# Patient Record
Sex: Female | Born: 1950 | Race: White | Hispanic: No | Marital: Married | Smoking: Former smoker
Health system: Southern US, Community
[De-identification: ages and names within clinical notes are randomized; demographics above are authoritative.]

## PROBLEM LIST (undated history)

## (undated) DIAGNOSIS — K219 Gastro-esophageal reflux disease without esophagitis: Secondary | ICD-10-CM

## (undated) DIAGNOSIS — F329 Major depressive disorder, single episode, unspecified: Secondary | ICD-10-CM

## (undated) DIAGNOSIS — F32A Depression, unspecified: Secondary | ICD-10-CM

## (undated) DIAGNOSIS — M1990 Unspecified osteoarthritis, unspecified site: Secondary | ICD-10-CM

## (undated) DIAGNOSIS — G5602 Carpal tunnel syndrome, left upper limb: Secondary | ICD-10-CM

## (undated) DIAGNOSIS — F988 Other specified behavioral and emotional disorders with onset usually occurring in childhood and adolescence: Secondary | ICD-10-CM

## (undated) DIAGNOSIS — E039 Hypothyroidism, unspecified: Secondary | ICD-10-CM

## (undated) DIAGNOSIS — Z862 Personal history of diseases of the blood and blood-forming organs and certain disorders involving the immune mechanism: Secondary | ICD-10-CM

## (undated) DIAGNOSIS — E7800 Pure hypercholesterolemia, unspecified: Secondary | ICD-10-CM

---

## 1998-07-16 ENCOUNTER — Other Ambulatory Visit: Admission: RE | Admit: 1998-07-16 | Discharge: 1998-07-16 | Payer: Self-pay | Admitting: Radiology

## 1998-09-19 ENCOUNTER — Other Ambulatory Visit: Admission: RE | Admit: 1998-09-19 | Discharge: 1998-09-19 | Payer: Self-pay | Admitting: *Deleted

## 1999-09-23 ENCOUNTER — Other Ambulatory Visit: Admission: RE | Admit: 1999-09-23 | Discharge: 1999-09-23 | Payer: Self-pay | Admitting: Obstetrics and Gynecology

## 1999-10-04 ENCOUNTER — Ambulatory Visit (HOSPITAL_COMMUNITY): Admission: RE | Admit: 1999-10-04 | Discharge: 1999-10-04 | Payer: Self-pay | Admitting: Gastroenterology

## 1999-10-04 ENCOUNTER — Encounter (INDEPENDENT_AMBULATORY_CARE_PROVIDER_SITE_OTHER): Payer: Self-pay | Admitting: Specialist

## 2000-09-09 ENCOUNTER — Ambulatory Visit (HOSPITAL_COMMUNITY): Admission: RE | Admit: 2000-09-09 | Discharge: 2000-09-09 | Payer: Self-pay | Admitting: Internal Medicine

## 2000-09-24 ENCOUNTER — Other Ambulatory Visit: Admission: RE | Admit: 2000-09-24 | Discharge: 2000-09-24 | Payer: Self-pay | Admitting: *Deleted

## 2001-06-29 ENCOUNTER — Encounter (INDEPENDENT_AMBULATORY_CARE_PROVIDER_SITE_OTHER): Payer: Self-pay | Admitting: Specialist

## 2001-06-29 ENCOUNTER — Ambulatory Visit (HOSPITAL_COMMUNITY): Admission: RE | Admit: 2001-06-29 | Discharge: 2001-06-29 | Payer: Self-pay | Admitting: Obstetrics and Gynecology

## 2001-06-29 HISTORY — PX: COMBINED HYSTEROSCOPY DIAGNOSTIC / D&C: SUR297

## 2001-09-14 ENCOUNTER — Other Ambulatory Visit: Admission: RE | Admit: 2001-09-14 | Discharge: 2001-09-14 | Payer: Self-pay | Admitting: Obstetrics and Gynecology

## 2002-09-26 ENCOUNTER — Other Ambulatory Visit: Admission: RE | Admit: 2002-09-26 | Discharge: 2002-09-26 | Payer: Self-pay | Admitting: Obstetrics and Gynecology

## 2003-09-28 ENCOUNTER — Other Ambulatory Visit: Admission: RE | Admit: 2003-09-28 | Discharge: 2003-09-28 | Payer: Self-pay | Admitting: Obstetrics and Gynecology

## 2009-04-03 ENCOUNTER — Encounter: Payer: Self-pay | Admitting: Cardiology

## 2009-05-15 ENCOUNTER — Ambulatory Visit: Payer: Self-pay | Admitting: Oncology

## 2009-05-29 LAB — CBC WITH DIFFERENTIAL/PLATELET
BASO%: 1.3 % (ref 0.0–2.0)
Basophils Absolute: 0 10*3/uL (ref 0.0–0.1)
EOS%: 5.3 % (ref 0.0–7.0)
Eosinophils Absolute: 0.2 10*3/uL (ref 0.0–0.5)
HCT: 43.9 % (ref 34.8–46.6)
HGB: 15.1 g/dL (ref 11.6–15.9)
LYMPH%: 41 % (ref 14.0–49.7)
MCH: 34.6 pg — ABNORMAL HIGH (ref 25.1–34.0)
MCHC: 34.3 g/dL (ref 31.5–36.0)
MCV: 100.9 fL (ref 79.5–101.0)
MONO#: 0.3 10*3/uL (ref 0.1–0.9)
MONO%: 8.8 % (ref 0.0–14.0)
NEUT#: 1.5 10*3/uL (ref 1.5–6.5)
NEUT%: 43.6 % (ref 38.4–76.8)
Platelets: 219 10*3/uL (ref 145–400)
RBC: 4.35 10*6/uL (ref 3.70–5.45)
RDW: 12.6 % (ref 11.2–14.5)
WBC: 3.5 10*3/uL — ABNORMAL LOW (ref 3.9–10.3)
lymph#: 1.4 10*3/uL (ref 0.9–3.3)

## 2009-05-29 LAB — CHCC SMEAR

## 2009-05-29 LAB — MORPHOLOGY
PLT EST: ADEQUATE
RBC Comments: NORMAL

## 2009-05-29 LAB — SEDIMENTATION RATE: Sed Rate: 1 mm/hr (ref 0–22)

## 2009-05-29 LAB — PROTHROMBIN TIME
INR: 0.92 (ref <1.50)
Prothrombin Time: 12.3 seconds (ref 11.6–15.2)

## 2009-05-29 LAB — APTT: aPTT: 28 seconds (ref 24–37)

## 2009-06-05 LAB — COMPREHENSIVE METABOLIC PANEL
ALT: 21 U/L (ref 0–35)
AST: 23 U/L (ref 0–37)
Albumin: 4.7 g/dL (ref 3.5–5.2)
Alkaline Phosphatase: 82 U/L (ref 39–117)
BUN: 17 mg/dL (ref 6–23)
CO2: 27 mEq/L (ref 19–32)
Calcium: 10.4 mg/dL (ref 8.4–10.5)
Chloride: 103 mEq/L (ref 96–112)
Creatinine, Ser: 0.79 mg/dL (ref 0.40–1.20)
Glucose, Bld: 92 mg/dL (ref 70–99)
Potassium: 4.3 mEq/L (ref 3.5–5.3)
Sodium: 142 mEq/L (ref 135–145)
Total Bilirubin: 0.3 mg/dL (ref 0.3–1.2)
Total Protein: 7 g/dL (ref 6.0–8.3)

## 2009-06-05 LAB — ANA: Anti Nuclear Antibody(ANA): POSITIVE — AB

## 2009-06-05 LAB — ANTI-NUCLEAR AB-TITER (ANA TITER): ANA Titer 1: 1:40 {titer} — ABNORMAL HIGH

## 2009-06-05 LAB — VON WILLEBRAND PANEL
Factor-VIII Activity: 108 % (ref 50–150)
Ristocetin-Cofactor: 137 % (ref 50–150)
Von Willebrand Ag: 127 % normal (ref 61–164)

## 2009-06-05 LAB — LACTATE DEHYDROGENASE: LDH: 213 U/L (ref 94–250)

## 2010-03-29 ENCOUNTER — Encounter: Payer: Self-pay | Admitting: Cardiology

## 2010-04-08 ENCOUNTER — Encounter: Payer: Self-pay | Admitting: Cardiology

## 2010-04-18 ENCOUNTER — Ambulatory Visit: Payer: Self-pay | Admitting: Cardiology

## 2010-04-18 DIAGNOSIS — E7800 Pure hypercholesterolemia, unspecified: Secondary | ICD-10-CM | POA: Insufficient documentation

## 2010-04-18 DIAGNOSIS — R000 Tachycardia, unspecified: Secondary | ICD-10-CM | POA: Insufficient documentation

## 2010-05-10 ENCOUNTER — Ambulatory Visit: Payer: Self-pay | Admitting: Cardiology

## 2010-05-10 ENCOUNTER — Ambulatory Visit: Payer: Self-pay

## 2010-06-22 ENCOUNTER — Encounter: Payer: Self-pay | Admitting: Obstetrics and Gynecology

## 2010-07-02 NOTE — Letter (Signed)
Summary: [facility] Annual Physical Exam Note   [facility] Annual Physical Exam Note   Imported By: Roderic Ovens 05/09/2010 09:05:56  _____________________________________________________________________  External Attachment:    Type:   Image     Comment:   External Document

## 2010-07-02 NOTE — Miscellaneous (Addendum)
Summary: Simvastatin 80 mg and l/l 8 weeks  Clinical Lists Changes  Medications: Added new medication of SIMVASTATIN 80 MG TABS (SIMVASTATIN) one daily - Signed Rx of SIMVASTATIN 80 MG TABS (SIMVASTATIN) one daily;  #30 x 11;  Signed;  Entered by: Charolotte Capuchin, RN;  Authorized by: Rollene Rotunda, MD, [facility];  Method used: [facility] [address] [phone]*, [address], [place], [place], [place]  [address], Ph: [phone] or [phone], Fax: [phone]    Prescriptions: SIMVASTATIN 80 MG TABS (SIMVASTATIN) one daily  #30 x 11   Entered by:   Charolotte Capuchin, RN   Authorized by:   Rollene Rotunda, MD, [facility]   Signed by:   Charolotte Capuchin, RN on 05/10/2010   Method used:   [facility]ronically to        [facility] [address] [phone]* (retail)       [address]       [place], [place]  [address]       Ph: [phone] or [phone]       Fax: [phone]   RxID:   [id number]   Have fasting lipid and liver profile in 8 weeks   272.0  v58.69  Appended Document: Simvastatin 80 mg and l/l 8 weeks Prescription is to be for pravastatin 80 mg. Discussed with the patient and she has not yet started the simvastatin.  We will change the prescription.

## 2010-07-02 NOTE — Assessment & Plan Note (Signed)
Summary: [facility]   Visit Type:  Initial Consult Primary Provider:  Dr. Rodrigo Ran  CC:  Tachycardia and hyperlipidemia.  History of Present Illness: Patient presents for evaluation of dyslipidemia. She has no history of coronary artery disease. She has never had any cardiovascular testing. However, she has had dyslipidemia. The most recent cholesterol demonstrated a total of 318, triglycerides 100, HDL 96 and LDL 200. The subfractionation of this apparently shows a ratio of large to small particle size. Lipid therapy has not been indicated. However, the patient wanted to discuss this further and was concerned about overall risk.  She does not have any overt cardiovascular symptoms. In particular she denies palpitations, presyncope or syncope. She does know that she has a rapid resting heart rate at times. She does have ADHD and takes medications. She is "high strung". She does not exercise routinely though she says she is "always on the go". She denies any chest pressure, neck or arm discomfort. She says she has no shortness of breath, PND or orthopnea. \ Current Medications (verified): 1)  Levothroid 75 Mcg Tabs (Levothyroxine Sodium) .... Uad 2)  Vyvanse 60 Mg Caps (Lisdexamfetamine Dimesylate) .Marland Kitchen.. 1 By Mouth Daily 3)  Effexor Xr 75 Mg Xr24h-Cap (Venlafaxine Hcl) .... 3 By Mouth Daily 4)  Protonix 40 Mg Solr (Pantoprazole Sodium) .Marland Kitchen.. 1 By Mouth Daily 5)  Vitamin D3 2000 Unit Caps (Cholecalciferol) .Marland Kitchen.. 1 By Mouth Daily 6)  Caltrate 600 1500 Mg Tabs (Calcium Carbonate) .... 2 By Mouth Daily 7)  Zyrtec Allergy 10 Mg Tabs (Cetirizine Hcl) .Marland Kitchen.. 1 By Mouth Daily 8)  Vitamin C 1000 Mg Tabs (Ascorbic Acid) .Marland Kitchen.. 1 By Mouth Daily 9)  Multivitamins   Tabs (Multiple Vitamin) .Marland Kitchen.. 1 By Mouth Daily 10)  Vitamin E 400 Unit Chew (Vitamin E) .Marland Kitchen.. 1 By Mouth Daily 11)  Fish Oil   Oil (Fish Oil) .Marland Kitchen.. 1 By Mouth Daily 12)  Coq10 30 Mg Caps (Coenzyme Q10) .Marland Kitchen.. 1 By Mouth Daily  Allergies  (verified): 1)  ! Penicillin  Past History:  Past Medical History: ADD Anxiety/situational depression Chronic headaches Dyslipidemia Asthma Osteoarthritis  Family History: No early heart disease  Social History: She is married. 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 She quit smoking in 1975 after a brief history.  Review of Systems       Positive occasional dizziness, constipation, reflux, joint pains. Otherwise as stated in the history of present illness negative for all other systems.  Vital Signs:  Patient profile:   60 year old female Height:      62 inches Weight:      114 pounds BMI:     20.93 Pulse rate:   104 / minute Resp:     16 per minute BP sitting:   122 / 78  (right arm)  Vitals Entered By: Marrion Coy, CNA (April 18, 2010 10:35 AM)  Physical Exam  General:  Well developed, well nourished, in no acute distress. Head:  normocephalic and atraumatic Eyes:  PERRLA/EOM intact; conjunctiva and lids normal. Mouth:  Teeth, gums and palate normal. Oral mucosa normal. Neck:  Neck supple, no JVD. No masses, thyromegaly or abnormal cervical nodes. Chest Wall:  no deformities or breast masses noted Lungs:  Clear bilaterally to auscultation and percussion. Abdomen:  Bowel sounds positive; abdomen soft and non-tender without masses, organomegaly, or hernias noted. No hepatosplenomegaly. Msk:  Back normal, normal gait. Muscle strength and tone normal. Extremities:  No clubbing or  cyanosis. Neurologic:  Alert and oriented x 3. Skin:  Intact without lesions or rashes. Cervical Nodes:  no significant adenopathy Axillary Nodes:  no significant adenopathy Inguinal Nodes:  no significant adenopathy Psych:  Normal affect.   Detailed Cardiovascular Exam  Neck    Carotids: Carotids full and equal bilaterally without bruits.      Neck Veins: Normal, no JVD.    Heart    Inspection: no deformities or lifts noted.        Palpation: normal PMI with no thrills palpable.      Auscultation: regular rate and rhythm, S1, S2 without murmurs, rubs, gallops, or clicks.    Vascular    Abdominal Aorta: no palpable masses, pulsations, or audible bruits.      Femoral Pulses: normal femoral pulses bilaterally.      Pedal Pulses: normal pedal pulses bilaterally.      Radial Pulses: normal radial pulses bilaterally.      Peripheral Circulation: no clubbing, cyanosis, or edema noted with normal capillary refill.     EKG  Procedure date:  04/18/2010  Findings:      Sinus rhythm, rate 103, axis within normal limits, diffuse nonspecific T wave flattening  Impression & Recommendations:  Problem # 1:  HYPERCHOLESTEROLEMIA (ICD-272.0) She does have a markedly elevated LDL and current guidelines would suggest if this does not fall below 160 treatment with a statin would be reasonable. However, I do agree that the more important LDL particle is p and not c.  I would like to review her Liposcience and discuss this further with her.  Problem # 2:  UNSPECIFIED TACHYCARDIA (ICD-785.0)  Orders: EKG w/ Interpretation (93000) Treadmill (Treadmill)  Patient Instructions: 1)  Your physician recommends that you schedule a follow-up appointment at the time of your treadmill 2)  Your physician recommends that you continue on your current medications as directed. Please refer to the Current Medication list given to you today. 3)  Your physician has requested that you have an exercise tolerance test.  For further information please visit [email or web address]  Please also follow instruction sheet, as given.

## 2010-07-02 NOTE — Assessment & Plan Note (Signed)
Summary: DUPLICATE   DO NOT USE    Impression & Recommendations: Duplicate  DO NOT USE  Other Orders: EKG w/ Interpretation (93000)  Patient Instructions: 1)  Your physician recommends that you schedule a follow-up appointment at the time of your treadmill 2)  Your physician recommends that you continue on your current medications as directed. Please refer to the Current Medication list given to you today. 3)  Your physician has requested that you have an exercise tolerance test.  For further information please visit [email or web address]  Please also follow instruction sheet, as given.

## 2010-07-04 NOTE — Miscellaneous (Signed)
Summary: pravastatin 80 mg    Clinical Lists Changes  Medications: Removed medication of SIMVASTATIN 80 MG TABS (SIMVASTATIN) one daily Added new medication of PRAVASTATIN SODIUM 80 MG TABS (PRAVASTATIN SODIUM) one every evening - Signed Rx of PRAVASTATIN SODIUM 80 MG TABS (PRAVASTATIN SODIUM) one every evening;  #30 x 11;  Signed;  Entered by: Charolotte Capuchin, RN;  Authorized by: Rollene Rotunda, MD, [facility];  Method used: [facility] [address] [phone]*, [address], [place], [place], [place]  [address], Ph: [phone] or [phone], Fax: [phone]    Prescriptions: PRAVASTATIN SODIUM 80 MG TABS (PRAVASTATIN SODIUM) one every evening  #30 x 11   Entered by:   Charolotte Capuchin, RN   Authorized by:   Rollene Rotunda, MD, [facility]   Signed by:   Charolotte Capuchin, RN on 05/13/2010   Method used:   [facility]ronically to        [facility] [address] [phone]* (retail)       [address]       [place], [place]  [address]       Ph: [phone] or [phone]       Fax: [phone]   RxID:   [id number]

## 2010-07-04 NOTE — Letter (Signed)
Summary: [facility] Annual Physical Note   [facility] Annual Physical Note   Imported By: Roderic Ovens 05/14/2010 16:18:13  _____________________________________________________________________  External Attachment:    Type:   Image     Comment:   External Document

## 2010-08-07 ENCOUNTER — Other Ambulatory Visit: Payer: Self-pay

## 2010-08-21 ENCOUNTER — Ambulatory Visit (INDEPENDENT_AMBULATORY_CARE_PROVIDER_SITE_OTHER): Payer: BC Managed Care – PPO | Admitting: *Deleted

## 2010-08-21 DIAGNOSIS — E7800 Pure hypercholesterolemia, unspecified: Secondary | ICD-10-CM

## 2010-08-21 DIAGNOSIS — Z79899 Other long term (current) drug therapy: Secondary | ICD-10-CM

## 2010-08-21 DIAGNOSIS — E785 Hyperlipidemia, unspecified: Secondary | ICD-10-CM

## 2010-08-21 LAB — HEPATIC FUNCTION PANEL
ALT: 22 U/L (ref 0–35)
AST: 25 U/L (ref 0–37)
Albumin: 4.2 g/dL (ref 3.5–5.2)
Alkaline Phosphatase: 56 U/L (ref 39–117)
Bilirubin, Direct: 0.1 mg/dL (ref 0.0–0.3)
Total Bilirubin: 0.4 mg/dL (ref 0.3–1.2)
Total Protein: 6.4 g/dL (ref 6.0–8.3)

## 2010-08-21 LAB — LIPID PANEL
Cholesterol: 252 mg/dL — ABNORMAL HIGH (ref 0–200)
HDL: 100.1 mg/dL (ref >39.00)
Total CHOL/HDL Ratio: 3
Triglycerides: 89 mg/dL (ref 0.0–149.0)
VLDL: 17.8 mg/dL (ref 0.0–40.0)

## 2010-08-21 LAB — LDL CHOLESTEROL, DIRECT: Direct LDL: 127 mg/dL

## 2010-08-28 ENCOUNTER — Encounter: Payer: Self-pay | Admitting: *Deleted

## 2010-08-28 NOTE — Progress Notes (Signed)
Letter of results mailed to pt. 

## 2010-08-28 NOTE — Progress Notes (Signed)
Letter of results mailed to patient.

## 2010-09-04 ENCOUNTER — Telehealth: Payer: Self-pay | Admitting: Cardiology

## 2010-09-04 NOTE — Telephone Encounter (Signed)
Patient wanted to know if she needs to have  a liver profile done, because she only got the Lipid profile result. I let pt. Know she got a liver profile the same day with lipid profile on 08/21/10. Results Faxed to [phone] per patient request.

## 2010-10-18 NOTE — H&P (Signed)
[facility]  Patient:    [name], [name] Visit Number: [id number] MRN: [id number]          Service Type: DSU Location: [facility] Attending Physician:  Lenoard Aden Dictated by:   Lenoard Aden, M.D. Admit Date:  06/29/2001   CC:         [facility]                         History and Physical  PREOPERATIVE DIAGNOSES: 1. Post menopausal bleeding. 2. History of menometrial hyperplasia.  HISTORY OF PRESENT ILLNESS:  A 60 year old white female G1, P1 with a history of persistent post menopausal bleeding who presents for definitive evaluation in the form of hysteroscopy.  PAST MEDICAL HISTORY:  ALLERGIES:  Remarkable for allergies to PENICILLIN.  MEDICATIONS:  Prometrium.  PAST MEDICAL HISTORY:  History of asthma and osteopenia and hypothyroidism.  MEDICATIONS: 1. Levoxyl 2. Fosamax 3. Effexor 4. Flonase 5. Fiorinal for migraine headaches. 6. Pulmicort.  PREVIOUS HOSPITALIZATIONS:  Previous hospitalization related to asthma and childbirth.  PHYSICAL EXAMINATION:  GENERAL:  She is 5 foot 2 inches, 114 lbs.  HEENT:  Normal.  LUNGS:  Clear.  HEART:  Regular rate and rhythm.  ABDOMEN:  Soft, scaphoid, nontender.  PELVIC EXAMINATION:  Reveals a small anteflexed uterus.  No adnexal masses on Saline sonohysterogram with posterior thickening and questionable endometrial polyp.  EXTREMITIES:  Show no cords.  NEUROLOGIC:  Nonfocal.  IMPRESSION:  Simple hyperplasia with persistent postmenopausal bleeding. Nontolerance to progestational therapy with continued intermittent bleeding.  PLAN:  Proceed with diagnostic hysteroscopy dilatation and curettage. The risks of anesthesia, infection, bleeding, uterine perforation and need for repair is discussed.  The patient Acknowledges and desires to proceed. Delayed versus immediate complications to include bowel and bladder injury with need for repair noted. Dictated by:    Lenoard Aden, M.D. Attending Physician:  Lenoard Aden DD:  06/29/01 TD:  06/29/01 Job: [id number]

## 2010-10-18 NOTE — Op Note (Signed)
[facility]  Patient:    [name], [name]                     MRN: [id number] Proc. Date: 10/04/99 Adm. Date:  [id number] Disc. Date: [id number] Attending:  Nelda Marseille CC:         Petra Kuba, M.D.             Edwin L. Judie Grieve, M.D.             Sung Amabile Roslyn Smiling, M.D.                           Operative Report  PROCEDURE:  Colonoscopy.  INDICATIONS FOR PROCEDURE:  Guaiac positivity and normal rectal examination. Consent was signed after risks, benefits, methods and options were thoroughly discussed in the office.  MEDICATIONS USED:  Demerol 100 mg, Versed 10 mg.  DESCRIPTION OF PROCEDURE:  Rectal inspection was pertinent for external hemorrhoids -- small.  Digital examination was negative.  I did not appreciate any mass or polyps.  The pediatric video colonoscope was inserted; no rectal abnormality was seen. The scope was then retroflexed, revealing an internal hemorrhoid, but no other abnormality.  The scope was then straightened; there was some difficulty due to a tortuous colon.  We were able to advance to the cecum (this did required lowering her on her back and some abdominal pressure.  The cecum was identified by the appendiceal orifice and the ileocecal valve.  No obvious abnormality was seen on insertion.   We then insert the scope into the terminal ileum, which was normal.  Photo documentation was obtained.  The scope was slowly withdrawn into the cecum, ascending, and transverse (all normal).  In the mid descending a tiny, questionable 1-2 mm polyp was seen. This was cold biopsied x 1; possibly this was just some redness through the scope.  The scope was further withdrawn.  No other abnormalities were seen  as we withdrew back to the rectum.  Anorectal pull-through confirmed the hemorrhoids.  The scope was again retroflexed; again confirmed the hemorrhoids. The scope was drained, air was withdrawn and the scope removed.  The  patient tolerated the procedure well.  There were no obvious or immediate complications.  ENDOSCOPIC DIAGNOSES: 1. Internal and external hemorrhoids. 2. Tortuous colon. 3. Tiny, questionable mid descending polyps, status post cold biopsy. 4. Large than within normal limits of the terminal ileum.  PLAN:  Await pathology to determine the future colonic screening.  New rectals and guaiacs per the gynecologist or primary care.  Will see the patient back in six weeks to recheck rectal guaiac and make sure no further workup plans are needed.  Possibly will need a repeat CBC. Have the patient call me sooner or p.r.n. DD:  10/04/99 TD:  10/07/99 Job: [id number]

## 2010-10-18 NOTE — Op Note (Signed)
[facility]  Patient:    [name], [name] Visit Number: [id number] MRN: [id number]          Service Type: DSU Location: [facility] Attending Physician:  Lenoard Aden Dictated by:   Lenoard Aden, M.D. Proc. Date: 06/29/01 Admit Date:  06/29/2001                             Operative Report  PREOPERATIVE DIAGNOSIS:       Postmenopausal bleeding and cervical stenosis.  POSTOPERATIVE DIAGNOSIS:      Postmenopausal bleeding and cervical stenosis, endometrial polyp.  OPERATION:                    Diagnostic hysteroscopy with resectoscopic polypectomy, dilatation and curettage.  SURGEON:                      Lenoard Aden, M.D.  ANESTHESIA:                   General.  ESTIMATED BLOOD LOSS:         50 cc.  COMPLICATIONS:                None.  FLUID DEFICIT:                50 cc.  SPECIMEN:                     Endometrial polyp and endometrial curetting.  DISPOSITION:                  To recovery room in good condition.  PROCEDURE:                    After being apprised of the risks of anesthesia, infection, bleeding, uterine perforation and need for repair, patient was brought to the operating room where she was administered general anesthetic without complications.  Prepped and draped in usual sterile fashion. Catheterized until the bladder was empty.  At this time weighted speculum placed.  Dilute Pitressin solution placed at 3 and 9 oclock at the cervicovaginal junction without difficulty.  Cervix was easily dilated after previous removal prior to prepping of the two sponges and laminaria without difficulty which made the cervix easily dilated #31 Pratt dilator. Resectoscope and hysteroscope placed.  Visualization reveals a polypoid mass along the posterior wall which was resected without difficulty and sent to pathology.  The intrauterine cavity appears to be thickened with endometrium but no other discreet masses are noted.   Tubal ostia appear normal. Endocervical canal appears normal.  D&C is performed.  Revisualization reveals good hemostasis and no evidence of focal masses.  At this time the procedure is discontinued.  Fluid deficit of 50 cc is noted.  Patient tolerates the procedure well.  Is awakened and transferred to recovery in good condition. Dictated by:   Lenoard Aden, M.D. Attending Physician:  Lenoard Aden DD:  06/29/01 TD:  06/29/01 Job: [id number]

## 2011-05-01 ENCOUNTER — Other Ambulatory Visit: Payer: Self-pay | Admitting: Obstetrics and Gynecology

## 2011-05-01 DIAGNOSIS — M949 Disorder of cartilage, unspecified: Secondary | ICD-10-CM

## 2011-05-01 DIAGNOSIS — M899 Disorder of bone, unspecified: Secondary | ICD-10-CM

## 2011-06-26 ENCOUNTER — Other Ambulatory Visit: Payer: Self-pay | Admitting: Cardiology

## 2011-06-26 NOTE — Telephone Encounter (Signed)
PT OUT OF MED, PHARMACY SAID THEY SENT REQUEST BEGINNING OF WEEK, BUT I TOLD HER I ONLY SEE THE ONE FOR THIS AM, NEEDS CALED IN TODAY, PT WANTS TO KNOW IF LABS  ARE NEEDED TO CONTINUE MED, HASN'T SEEN DR OR HAD LABS SINCE STARTED MED TO PLS CALL PT [phone]

## 2012-01-01 DIAGNOSIS — G5602 Carpal tunnel syndrome, left upper limb: Secondary | ICD-10-CM

## 2012-01-01 HISTORY — DX: Carpal tunnel syndrome, left upper limb: G56.02

## 2012-01-26 ENCOUNTER — Encounter (HOSPITAL_BASED_OUTPATIENT_CLINIC_OR_DEPARTMENT_OTHER): Payer: Self-pay | Admitting: *Deleted

## 2012-01-26 ENCOUNTER — Other Ambulatory Visit: Payer: Self-pay | Admitting: Orthopedic Surgery

## 2012-02-02 NOTE — H&P (Signed)
  [name] is an 61 y.o. female.   Chief Complaint: c/o chronic and progressive numbness and tingling of the left hand  HPI: .  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  She reports a history of hand numbness dating back to 2004.  At that time, she was evaluated by Dr. Phylliss Snyder for degenerative arthritis symptoms and hand numbness.  She was referred to see Dr. Meryl Snyder and had an electrodiagnostic study that was reportedly positive for carpal tunnel syndrome.  She has been using nighttime splints.  Recently, she has been experiencing hand numbness seven out of seven nights a week.  She has had some stiffness and discomfort in her cervical spine with extension and rotation movements.  To date, she has had no formal evaluation of her cervical spine.  Past Medical History  Diagnosis Date  . Hypothyroidism   . History of anemia     no current problems  . Osteoarthritis   . GERD (gastroesophageal reflux disease)   . Attention deficit disorder (ADD)   . Depression   . 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 Bruises easily   . Abrasion of hand, left 01/26/2012  . High cholesterol     Past Surgical History  Procedure Date  . Combined hysteroscopy diagnostic / d&c 06/29/2001    with resectoscopic polypectomy    History reviewed. No pertinent family history. Social History:  reports that she has never smoked. She has never used smokeless tobacco. She reports that she does not drink alcohol or use illicit drugs.  Allergies:  Allergies  Allergen Reactions  . Penicillins Other (See Comments)    SYNCOPE    No prescriptions prior to admission    No results found for this or any previous visit (from the past 48 hour(s)).  No results found.   Pertinent items are noted in HPI.  Height 5' 2.5" (1.588 m), weight 48.535 kg (107 lb).  General appearance: alert Head: Normocephalic, without obvious abnormality Neck: supple, symmetrical, trachea  midline Resp: clear to auscultation bilaterally Cardio: regular rate and rhythm GI: normal findings: bowel sounds normal Extremities: .  Inspection of her hands reveals no thenar atrophy.  Her sweat patterns and dermatoglyphics are normal.  She has a positive wrist flexion test bilaterally and a positive Tinel's percussion sign bilaterally.  She has no sign of stenosing tenosynovitis.  She has slight discomfort at the limits of cervical rotation, but no radicular signs or symptoms. Her pulses and capillary refill are normal.  She has mild Heberden's nodes.  X-rays of her right and left hands reveal normal bony anatomy.  Her carpus is normal in appearance bilaterally.  Dr. Johna Snyder completed detailed electrodiagnostic studies.  These are remarkable for bilateral carpal tunnel syndrome, left greater than right. Pulses: 2+ and symmetric Skin: normal Neurologic: Grossly normal Hand exam:  minimal triggering of long and ring fingers of left hand equivalent to mild STS    Assessment/Plan Impression:Left CTS, tendonitis related symptoms  Plan: To the OR for Left CTR.The procedure, risks,benefits and post-op course were discussed with the patient at length and she was in agreement with the plan.  [name],[name] 02/02/2012, 8:02 PM  H&P documentation: 02/03/2012  -History and Physical Reviewed  -Patient has been re-examined  -No change in the plan of care  Destiny Forster, MD

## 2012-02-03 ENCOUNTER — Encounter (HOSPITAL_BASED_OUTPATIENT_CLINIC_OR_DEPARTMENT_OTHER): Payer: Self-pay | Admitting: Certified Registered Nurse Anesthetist

## 2012-02-03 ENCOUNTER — Ambulatory Visit (HOSPITAL_BASED_OUTPATIENT_CLINIC_OR_DEPARTMENT_OTHER): Payer: BC Managed Care – PPO | Admitting: Certified Registered Nurse Anesthetist

## 2012-02-03 ENCOUNTER — Encounter (HOSPITAL_BASED_OUTPATIENT_CLINIC_OR_DEPARTMENT_OTHER)
Admission: RE | Disposition: A | Discharge status: Home / Self Care | Payer: Self-pay | Source: Ambulatory Visit | Attending: Orthopedic Surgery

## 2012-02-03 ENCOUNTER — Ambulatory Visit (HOSPITAL_BASED_OUTPATIENT_CLINIC_OR_DEPARTMENT_OTHER)
Admission: RE | Admit: 2012-02-03 | Discharge: 2012-02-03 | Disposition: A | Discharge status: Home / Self Care | Payer: BC Managed Care – PPO | Source: Ambulatory Visit | Attending: Orthopedic Surgery | Admitting: Orthopedic Surgery

## 2012-02-03 ENCOUNTER — Encounter (HOSPITAL_BASED_OUTPATIENT_CLINIC_OR_DEPARTMENT_OTHER): Payer: Self-pay

## 2012-02-03 DIAGNOSIS — E7800 Pure hypercholesterolemia, unspecified: Secondary | ICD-10-CM | POA: Insufficient documentation

## 2012-02-03 DIAGNOSIS — M1990 Unspecified osteoarthritis, unspecified site: Secondary | ICD-10-CM | POA: Insufficient documentation

## 2012-02-03 DIAGNOSIS — E039 Hypothyroidism, unspecified: Secondary | ICD-10-CM | POA: Insufficient documentation

## 2012-02-03 DIAGNOSIS — K219 Gastro-esophageal reflux disease without esophagitis: Secondary | ICD-10-CM | POA: Insufficient documentation

## 2012-02-03 DIAGNOSIS — F988 Other specified behavioral and emotional disorders with onset usually occurring in childhood and adolescence: Secondary | ICD-10-CM | POA: Insufficient documentation

## 2012-02-03 DIAGNOSIS — G5600 Carpal tunnel syndrome, unspecified upper limb: Secondary | ICD-10-CM | POA: Insufficient documentation

## 2012-02-03 HISTORY — DX: Depression, unspecified: F32.A

## 2012-02-03 HISTORY — DX: Major depressive disorder, single episode, unspecified: F32.9

## 2012-02-03 HISTORY — DX: Other specified behavioral and emotional disorders with onset usually occurring in childhood and adolescence: F98.8

## 2012-02-03 HISTORY — DX: Gastro-esophageal reflux disease without esophagitis: K21.9

## 2012-02-03 HISTORY — DX: Hypothyroidism, unspecified: E03.9

## 2012-02-03 HISTORY — DX: Pure hypercholesterolemia, unspecified: E78.00

## 2012-02-03 HISTORY — PX: CARPAL TUNNEL RELEASE: SHX101

## 2012-02-03 HISTORY — DX: Personal history of diseases of the blood and blood-forming organs and certain disorders involving the immune mechanism: Z86.2

## 2012-02-03 HISTORY — DX: Unspecified osteoarthritis, unspecified site: M19.90

## 2012-02-03 HISTORY — DX: Carpal tunnel syndrome, left upper limb: G56.02

## 2012-02-03 SURGERY — CARPAL TUNNEL RELEASE
Anesthesia: General | Site: Wrist | Laterality: Left | Wound class: Clean

## 2012-02-03 MED ORDER — OXYCODONE HCL 5 MG/5ML PO SOLN: 5.0000 mg | Freq: Once | ORAL | Status: DC | PRN

## 2012-02-03 MED ORDER — HYDROMORPHONE HCL PF 1 MG/ML IJ SOLN: 0.2500 mg | INTRAMUSCULAR | Status: DC | PRN

## 2012-02-03 MED ORDER — OXYCODONE HCL 5 MG PO TABS: 5.0000 mg | ORAL_TABLET | Freq: Once | ORAL | Status: DC | PRN

## 2012-02-03 MED ORDER — METOCLOPRAMIDE HCL 5 MG/ML IJ SOLN: 10.0000 mg | Freq: Once | INTRAMUSCULAR | Status: DC | PRN

## 2012-02-03 MED ORDER — CHLORHEXIDINE GLUCONATE 4 % EX LIQD: 60.0000 mL | Freq: Once | CUTANEOUS | Status: DC

## 2012-02-03 MED ORDER — MIDAZOLAM HCL 5 MG/5ML IJ SOLN
INTRAMUSCULAR | Status: DC | PRN
  Administered 2012-02-03: 1 mg via INTRAVENOUS

## 2012-02-03 MED ORDER — ONDANSETRON HCL 4 MG/2ML IJ SOLN
INTRAMUSCULAR | Status: DC | PRN
  Administered 2012-02-03: 4 mg via INTRAVENOUS

## 2012-02-03 MED ORDER — LACTATED RINGERS IV SOLN
INTRAVENOUS | Status: DC
  Administered 2012-02-03 (×2): via INTRAVENOUS

## 2012-02-03 MED ORDER — LIDOCAINE HCL 2 % IJ SOLN
INTRAMUSCULAR | Status: DC | PRN
  Administered 2012-02-03: 3 mL

## 2012-02-03 MED ORDER — LIDOCAINE HCL (CARDIAC) 20 MG/ML IV SOLN
INTRAVENOUS | Status: DC | PRN
  Administered 2012-02-03: 50 mg via INTRAVENOUS

## 2012-02-03 MED ORDER — DEXAMETHASONE SODIUM PHOSPHATE 10 MG/ML IJ SOLN
INTRAMUSCULAR | Status: DC | PRN
  Administered 2012-02-03: 10 mg via INTRAVENOUS

## 2012-02-03 MED ORDER — PROPOFOL 10 MG/ML IV BOLUS
INTRAVENOUS | Status: DC | PRN
  Administered 2012-02-03: 200 mg via INTRAVENOUS

## 2012-02-03 MED ORDER — TRAMADOL HCL 50 MG PO TABS: ORAL_TABLET | ORAL | Status: AC

## 2012-02-03 MED ORDER — FENTANYL CITRATE 0.05 MG/ML IJ SOLN
INTRAMUSCULAR | Status: DC | PRN
  Administered 2012-02-03: 50 ug via INTRAVENOUS

## 2012-02-03 SURGICAL SUPPLY — 40 items
BANDAGE ADHESIVE 1X3 (GAUZE/BANDAGES/DRESSINGS) IMPLANT
BANDAGE ELASTIC 3 VELCRO ST LF (GAUZE/BANDAGES/DRESSINGS) ×2 IMPLANT
BLADE SURG 15 STRL LF DISP TIS (BLADE) ×1 IMPLANT
BLADE SURG 15 STRL SS (BLADE) ×2
BNDG CMPR 9X4 STRL LF SNTH (GAUZE/BANDAGES/DRESSINGS)
BNDG ESMARK 4X9 LF (GAUZE/BANDAGES/DRESSINGS) IMPLANT
BRUSH SCRUB EZ PLAIN DRY (MISCELLANEOUS) ×2 IMPLANT
CLOTH BEACON ORANGE TIMEOUT ST (SAFETY) ×2 IMPLANT
CORDS BIPOLAR (ELECTRODE) IMPLANT
COVER MAYO STAND STRL (DRAPES) ×2 IMPLANT
COVER TABLE BACK 60X90 (DRAPES) ×2 IMPLANT
CUFF TOURNIQUET SINGLE 18IN (TOURNIQUET CUFF) ×2 IMPLANT
DECANTER SPIKE VIAL GLASS SM (MISCELLANEOUS) IMPLANT
DRAPE EXTREMITY T 121X128X90 (DRAPE) ×2 IMPLANT
DRAPE SURG 17X23 STRL (DRAPES) ×2 IMPLANT
GLOVE BIO SURGEON STRL SZ 6.5 (GLOVE) ×2 IMPLANT
GLOVE BIOGEL M STRL SZ7.5 (GLOVE) ×2 IMPLANT
GLOVE EXAM NITRILE PF MED BLUE (GLOVE) ×2 IMPLANT
GLOVE ORTHO TXT STRL SZ7.5 (GLOVE) ×2 IMPLANT
GOWN BRE IMP PREV XXLGXLNG (GOWN DISPOSABLE) ×4 IMPLANT
GOWN PREVENTION PLUS XLARGE (GOWN DISPOSABLE) ×2 IMPLANT
NDL SAFETY ECLIPSE 18X1.5 (NEEDLE) ×1 IMPLANT
NEEDLE 27GAX1X1/2 (NEEDLE) ×2 IMPLANT
NEEDLE HYPO 18GX1.5 SHARP (NEEDLE) ×1
PACK BASIN DAY SURGERY FS (CUSTOM PROCEDURE TRAY) ×2 IMPLANT
PAD CAST 3X4 CTTN HI CHSV (CAST SUPPLIES) ×1 IMPLANT
PADDING CAST ABS 4INX4YD NS (CAST SUPPLIES)
PADDING CAST ABS COTTON 4X4 ST (CAST SUPPLIES) IMPLANT
PADDING CAST COTTON 3X4 STRL (CAST SUPPLIES) ×2
SPLINT PLASTER CAST XFAST 3X15 (CAST SUPPLIES) ×5 IMPLANT
SPLINT PLASTER XTRA FASTSET 3X (CAST SUPPLIES) ×5
SPONGE GAUZE 4X4 12PLY (GAUZE/BANDAGES/DRESSINGS) ×2 IMPLANT
STOCKINETTE 4X48 STRL (DRAPES) ×2 IMPLANT
STRIP CLOSURE SKIN 1/2X4 (GAUZE/BANDAGES/DRESSINGS) ×2 IMPLANT
SUT PROLENE 3 0 PS 2 (SUTURE) ×2 IMPLANT
SYR 3ML 23GX1 SAFETY (SYRINGE) IMPLANT
SYR CONTROL 10ML LL (SYRINGE) ×2 IMPLANT
TRAY DSU PREP LF (CUSTOM PROCEDURE TRAY) ×2 IMPLANT
UNDERPAD 30X30 INCONTINENT (UNDERPADS AND DIAPERS) ×2 IMPLANT
WATER STERILE IRR 1000ML POUR (IV SOLUTION) IMPLANT

## 2012-02-03 NOTE — Brief Op Note (Signed)
02/03/2012  8:12 AM  PATIENT:  [name]  61 y.o. female  PRE-OPERATIVE DIAGNOSIS:  left carpal tunnel syndrome  POST-OPERATIVE DIAGNOSIS:  left carpal tunnel syndrome  PROCEDURE:  Procedure(s) (LRB): CARPAL TUNNEL RELEASE (Left)  SURGEON:  Surgeon(s) and Role:    * Wyn Forster., MD - Primary  PHYSICIAN ASSISTANT:   ASSISTANTS:Taysen Bushart [name],P.A-C    ANESTHESIA:   general  EBL:  Total I/O In: 1000 [I.V.:1000] Out: -   BLOOD ADMINISTERED:none  DRAINS: none   LOCAL MEDICATIONS USED:  XYLOCAINE   SPECIMEN:  No Specimen  DISPOSITION OF SPECIMEN:  N/A  COUNTS:  YES  TOURNIQUET:   Total Tourniquet Time Documented: Upper Arm (Left) - 7 minutes  DICTATION: .Other Dictation: Dictation Number [id number]  PLAN OF CARE: Discharge to home after PACU  PATIENT DISPOSITION:  PACU - hemodynamically stable.

## 2012-02-03 NOTE — Anesthesia Preprocedure Evaluation (Signed)
Anesthesia Evaluation  Patient identified by MRN, date of birth, ID band Patient awake    Reviewed: Allergy & Precautions, H&P , NPO status , Patient's Chart, lab work & pertinent test results, reviewed documented beta blocker date and time   Airway Mallampati: II TM Distance: >3 FB Neck ROM: full    Dental   Pulmonary neg pulmonary ROS,  breath sounds clear to auscultation        Cardiovascular negative cardio ROS  Rhythm:regular     Neuro/Psych PSYCHIATRIC DISORDERS Depression  Neuromuscular disease    GI/Hepatic Neg liver ROS, GERD-  Medicated and Controlled,  Endo/Other  negative endocrine ROSHypothyroidism   Renal/GU negative Renal ROS  negative genitourinary   Musculoskeletal   Abdominal   Peds  Hematology negative hematology ROS (+)   Anesthesia Other Findings See surgeon's H&P   Reproductive/Obstetrics negative OB ROS                           Anesthesia Physical Anesthesia Plan  ASA: II  Anesthesia Plan: General   Post-op Pain Management:    Induction: Intravenous  Airway Management Planned: LMA  Additional Equipment:   Intra-op Plan:   Post-operative Plan: Extubation in OR  Informed Consent: I have reviewed the patients History and Physical, chart, labs and discussed the procedure including the risks, benefits and alternatives for the proposed anesthesia with the patient or authorized representative who has indicated his/her understanding and acceptance.   Dental Advisory Given  Plan Discussed with: CRNA and Surgeon  Anesthesia Plan Comments:         Anesthesia Quick Evaluation

## 2012-02-03 NOTE — Transfer of Care (Signed)
Immediate Anesthesia Transfer of Care Note  Patient: [name]  Procedure(s) Performed: Procedure(s) (LRB): CARPAL TUNNEL RELEASE (Left)  Patient Location: PACU  Anesthesia Type: General  Level of Consciousness: awake, alert , oriented and patient cooperative  Airway & Oxygen Therapy: Patient Spontanous Breathing and Patient connected to face mask oxygen  Post-op Assessment: Report given to PACU RN and Post -op Vital signs reviewed and stable  Post vital signs: Reviewed and stable  Complications: No apparent anesthesia complications

## 2012-02-03 NOTE — Anesthesia Procedure Notes (Signed)
Procedure Name: LMA Insertion Date/Time: 02/03/2012 7:51 AM Performed by: Tachina Spoonemore D Pre-anesthesia Checklist: Patient identified, Emergency Drugs available, Suction available and Patient being monitored Patient Re-evaluated:Patient Re-evaluated prior to inductionOxygen Delivery Method: Circle System Utilized Preoxygenation: Pre-oxygenation with 100% oxygen Intubation Type: IV induction Ventilation: Mask ventilation without difficulty LMA: LMA inserted LMA Size: 4.0 Number of attempts: 1 Airway Equipment and Method: bite block Placement Confirmation: positive ETCO2 Tube secured with: Tape Dental Injury: Teeth and Oropharynx as per pre-operative assessment

## 2012-02-03 NOTE — Anesthesia Postprocedure Evaluation (Signed)
Anesthesia Post Note  Patient: [name]  Procedure(s) Performed: Procedure(s) (LRB): CARPAL TUNNEL RELEASE (Left)  Anesthesia type: General  Patient location: PACU  Post pain: Pain level controlled  Post assessment: Patient's Cardiovascular Status Stable  Last Vitals:  Filed Vitals:   02/03/12 0925  BP: 119/88  Pulse: 58  Temp: 36.6 C  Resp: 16    Post vital signs: Reviewed and stable  Level of consciousness: alert  Complications: No apparent anesthesia complications

## 2012-02-04 ENCOUNTER — Encounter (HOSPITAL_BASED_OUTPATIENT_CLINIC_OR_DEPARTMENT_OTHER): Payer: Self-pay | Admitting: Orthopedic Surgery

## 2012-02-04 LAB — POCT HEMOGLOBIN-HEMACUE: Hemoglobin: 14.1 g/dL (ref 12.0–15.0)

## 2012-02-04 NOTE — Op Note (Signed)
[name], Snyder            ACCOUNT NO.:  [id number]  MEDICAL RECORD NO.:  [id number]  LOCATION:                                 FACILITY:  PHYSICIAN:  Katy Fitch. Gemini Beaumier, M.D.      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  OPERATION:  Release of left transverse carpal ligament.  OPERATING SURGEON:  Katy Fitch. Destiny Ly, MD  ASSISTANT:  Marveen Reeks [name], PA-C  ANESTHESIA:  General by LMA.  SUPERVISING ANESTHESIOLOGIST:  Janetta Hora. 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  Clinical examination revealed signs of bilateral early STT arthrosis, stenosing tenosynovitis of the long and ring fingers of the right hand and bilateral carpal tunnel syndrome.  Electrodiagnostic studies confirmed significant carpal tunnel syndrome.  We had detailed informed consent regarding management of all of these issues.  She has elected at this time to proceed with release of her left transcarpal ligament in an effort to relieve her numbness.  Preoperatively, she was reminded the potential risks and benefits of surgery.  We examined her in the holding area and noted that she had signs of STT arthrosis, no sign of a ganglion at the STT joint, and minimal stenosing tenosynovitis  symptoms of her long and ring fingers on the left that we plan to manage with exercises.  Questions were invited and answered in detail.  PROCEDURE:  [name] was brought to room #2 of the [facility] and placed in supine position on the operating table.  Following the induction of general anesthesia by LMA technique, the hand and arm were prepped with Betadine soap and solution, sterilely draped. A pneumatic tourniquet was applied to the proximal left brachium. Following exsanguination of the left arm with Esmarch bandage, the arterial tourniquet was inflated to 220 mmHg.  Following routine surgical time-out, the procedure commenced with short incision in line of the ring finger and the palm.  Subcutaneous tissues were carefully divided revealing the palmar fascia.  This was split longitudinally to reveal the common sensory branch of the median nerve. These were followed back to the transverse carpal ligament, which was gently isolated from the median nerve proper with a Insurance risk surveyor. A channel was created subcutaneously and deep to the transverse carpal ligament to allow use of scissors to release the ligament along its ulnar border extending into the distal forearm.  This widely opened the carpal canal.  No masses or other predicaments were noted.  Bleeding points along the margin of the released ligament were electrocauterized with bipolar current followed by repair of the skin with intradermal 3-0 Prolene  suture.  A compressive dressing was applied with a volar plaster splint maintaining the wrist in 15 degrees of dorsiflexion.  For aftercare, Ms. Weyers was provided prescriptions for Ultram 50 mg 1 p.o. q.4 hours p.r.n. pain, 20 tablets without refill.  We will see her back in followup in our office in 1 week.  At that time, we will detail exercises for her stenosing tenosynovitis.  Questions were invited and answered in detail.     Katy Fitch  Oluwatoyin Banales, M.D.     RVS/MEDQ  D:  02/03/2012  T:  02/04/2012  Job:  [id number]  cc:   Loraine Leriche A. Perini, M.D.

## 2012-03-30 ENCOUNTER — Other Ambulatory Visit: Payer: Self-pay | Admitting: Orthopedic Surgery

## 2012-04-14 ENCOUNTER — Encounter (HOSPITAL_BASED_OUTPATIENT_CLINIC_OR_DEPARTMENT_OTHER): Payer: Self-pay | Admitting: *Deleted

## 2012-04-16 ENCOUNTER — Other Ambulatory Visit: Payer: Self-pay | Admitting: Orthopedic Surgery

## 2012-04-19 NOTE — H&P (Signed)
  [name] is an 61 y.o. female.   Chief Complaint: c/o chronic and progressive numbness and tingling of the  HPI: [name] is known to have bilateral carpal tunnel syndrome. 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   [name] requested an electrodiagnostic study. She is noted to have significant bilateral carpal tunnel syndrome. She has moderately severe prolongation of the motor and sensory latencies left worse than right and markedly diminished sensory amplitude on the left.   I had a detailed informed consent with [name]. I would recommend proceeding with staged bilateral carpal tunnel release. We performed left CTR on 02/03/12 and she did well in the immediate post-op period and is now requesting surgery to the right hand.  Past Medical History  Diagnosis Date  . Hypothyroidism   . History of anemia     no current problems  . Osteoarthritis   . GERD (gastroesophageal reflux disease)   . Attention deficit disorder (ADD)   . Depression   . 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 Bruises easily   . Abrasion of hand, left 01/26/2012  . High cholesterol     Past Surgical History  Procedure Date  . Combined hysteroscopy diagnostic / d&c 06/29/2001    with resectoscopic polypectomy  . Carpal tunnel release 02/03/2012    Procedure: CARPAL TUNNEL RELEASE;  Surgeon: Wyn Forster., MD;  Location: [facility];  Service: Orthopedics;  Laterality: Left;    History reviewed. No pertinent family history. Social History:  reports that she has never smoked. She has never used smokeless tobacco. She reports that she does not drink alcohol or use illicit drugs.  Allergies:  Allergies  Allergen Reactions  . Penicillins Other (See Comments)    SYNCOPE    No prescriptions prior to admission    No results found for this or any previous visit (from the past 48 hour(s)).  No results  found.   Pertinent items are noted in HPI.  Height 5' 2.5" (1.588 m), weight 48.535 kg (107 lb).  General appearance: alert Head: Normocephalic, without obvious abnormality Neck: supple, symmetrical, trachea midline Resp: clear to auscultation bilaterally Cardio: regular rate and rhythm GI: normal findings: bowel sounds normal Extremities: In regards to the right hand she does continue with a positive Phalen's and Tinel's in addition to active triggering of the right ring and long fingers. NCV testing revealed moderate right CTS  Pulses: 2+ and symmetric Skin: normal Neurologic: Grossly normal    Assessment/Plan Impression: Right CTS and STS right long and ring fingers  Plan:To the OR for right CTS and release A-1 pulley right long and ring fingers. The procedure, risks,benefits and post-op course were discussed with the patient at length and they were in agreement with the plan.   [name],[name] J 04/19/2012, 11:55 AM     H&P documentation: 04/20/2012  -History and Physical Reviewed  -Patient has been re-examined  -No change in the plan of care  Wyn Forster, MD

## 2012-04-20 ENCOUNTER — Encounter (HOSPITAL_BASED_OUTPATIENT_CLINIC_OR_DEPARTMENT_OTHER): Payer: Self-pay | Admitting: Anesthesiology

## 2012-04-20 ENCOUNTER — Encounter (HOSPITAL_BASED_OUTPATIENT_CLINIC_OR_DEPARTMENT_OTHER): Payer: Self-pay | Admitting: *Deleted

## 2012-04-20 ENCOUNTER — Encounter (HOSPITAL_BASED_OUTPATIENT_CLINIC_OR_DEPARTMENT_OTHER)
Admission: RE | Disposition: A | Discharge status: Home / Self Care | Payer: Self-pay | Source: Ambulatory Visit | Attending: Orthopedic Surgery

## 2012-04-20 ENCOUNTER — Ambulatory Visit (HOSPITAL_BASED_OUTPATIENT_CLINIC_OR_DEPARTMENT_OTHER): Payer: BC Managed Care – PPO | Admitting: Anesthesiology

## 2012-04-20 ENCOUNTER — Ambulatory Visit (HOSPITAL_BASED_OUTPATIENT_CLINIC_OR_DEPARTMENT_OTHER)
Admission: RE | Admit: 2012-04-20 | Discharge: 2012-04-20 | Disposition: A | Discharge status: Home / Self Care | Payer: BC Managed Care – PPO | Source: Ambulatory Visit | Attending: Orthopedic Surgery | Admitting: Orthopedic Surgery

## 2012-04-20 DIAGNOSIS — E039 Hypothyroidism, unspecified: Secondary | ICD-10-CM | POA: Insufficient documentation

## 2012-04-20 DIAGNOSIS — G5600 Carpal tunnel syndrome, unspecified upper limb: Secondary | ICD-10-CM | POA: Insufficient documentation

## 2012-04-20 DIAGNOSIS — M65839 Other synovitis and tenosynovitis, unspecified forearm: Secondary | ICD-10-CM | POA: Insufficient documentation

## 2012-04-20 DIAGNOSIS — K219 Gastro-esophageal reflux disease without esophagitis: Secondary | ICD-10-CM | POA: Insufficient documentation

## 2012-04-20 HISTORY — PX: CARPAL TUNNEL RELEASE: SHX101

## 2012-04-20 HISTORY — PX: TRIGGER FINGER RELEASE: SHX641

## 2012-04-20 LAB — POCT HEMOGLOBIN-HEMACUE: Hemoglobin: 13.5 g/dL (ref 12.0–15.0)

## 2012-04-20 SURGERY — CARPAL TUNNEL RELEASE
Anesthesia: General | Site: Wrist | Laterality: Right | Wound class: Clean

## 2012-04-20 MED ORDER — LACTATED RINGERS IV SOLN
INTRAVENOUS | Status: DC
Start: 2012-04-20 — End: 2012-04-20
  Administered 2012-04-20: 07:00:00 via INTRAVENOUS

## 2012-04-20 MED ORDER — LIDOCAINE HCL 2 % IJ SOLN
INTRAMUSCULAR | Status: DC | PRN
  Administered 2012-04-20: 5 mL

## 2012-04-20 MED ORDER — CHLORHEXIDINE GLUCONATE 4 % EX LIQD: 60.0000 mL | Freq: Once | CUTANEOUS | Status: DC

## 2012-04-20 MED ORDER — OXYCODONE HCL 5 MG/5ML PO SOLN: 5.0000 mg | Freq: Once | ORAL | Status: DC | PRN

## 2012-04-20 MED ORDER — MEPERIDINE HCL 25 MG/ML IJ SOLN: 6.2500 mg | INTRAMUSCULAR | Status: DC | PRN

## 2012-04-20 MED ORDER — TRAMADOL HCL 50 MG PO TABS: ORAL_TABLET | ORAL | Status: DC

## 2012-04-20 MED ORDER — DEXAMETHASONE SODIUM PHOSPHATE 10 MG/ML IJ SOLN
INTRAMUSCULAR | Status: DC | PRN
  Administered 2012-04-20: 10 mg via INTRAVENOUS

## 2012-04-20 MED ORDER — MIDAZOLAM HCL 5 MG/5ML IJ SOLN
INTRAMUSCULAR | Status: DC | PRN
  Administered 2012-04-20: 2 mg via INTRAVENOUS

## 2012-04-20 MED ORDER — ONDANSETRON HCL 4 MG/2ML IJ SOLN
INTRAMUSCULAR | Status: DC | PRN
  Administered 2012-04-20: 4 mg via INTRAVENOUS

## 2012-04-20 MED ORDER — FENTANYL CITRATE 0.05 MG/ML IJ SOLN
INTRAMUSCULAR | Status: DC | PRN
  Administered 2012-04-20: 50 ug via INTRAVENOUS

## 2012-04-20 MED ORDER — OXYCODONE HCL 5 MG PO TABS: 5.0000 mg | ORAL_TABLET | Freq: Once | ORAL | Status: DC | PRN

## 2012-04-20 MED ORDER — ONDANSETRON HCL 4 MG/2ML IJ SOLN: 4.0000 mg | Freq: Once | INTRAMUSCULAR | Status: DC | PRN

## 2012-04-20 MED ORDER — LIDOCAINE HCL (CARDIAC) 20 MG/ML IV SOLN
INTRAVENOUS | Status: DC | PRN
  Administered 2012-04-20: 100 mg via INTRAVENOUS

## 2012-04-20 MED ORDER — PROPOFOL 10 MG/ML IV BOLUS
INTRAVENOUS | Status: DC | PRN
  Administered 2012-04-20: 100 mg via INTRAVENOUS

## 2012-04-20 MED ORDER — HYDROMORPHONE HCL PF 1 MG/ML IJ SOLN: 0.2500 mg | INTRAMUSCULAR | Status: DC | PRN

## 2012-04-20 SURGICAL SUPPLY — 49 items
BANDAGE ADHESIVE 1X3 (GAUZE/BANDAGES/DRESSINGS) IMPLANT
BANDAGE ELASTIC 3 VELCRO ST LF (GAUZE/BANDAGES/DRESSINGS) ×3 IMPLANT
BLADE SURG 15 STRL LF DISP TIS (BLADE) ×2 IMPLANT
BLADE SURG 15 STRL SS (BLADE) ×3
BNDG CMPR 9X4 STRL LF SNTH (GAUZE/BANDAGES/DRESSINGS) ×2
BNDG CMPR MD 5X2 ELC HKLP STRL (GAUZE/BANDAGES/DRESSINGS) ×1
BNDG ELASTIC 2 VLCR STRL LF (GAUZE/BANDAGES/DRESSINGS) ×3 IMPLANT
BNDG ESMARK 4X9 LF (GAUZE/BANDAGES/DRESSINGS) ×3 IMPLANT
BRUSH SCRUB EZ PLAIN DRY (MISCELLANEOUS) ×3 IMPLANT
CLOTH BEACON ORANGE TIMEOUT ST (SAFETY) ×3 IMPLANT
CORDS BIPOLAR (ELECTRODE) ×3 IMPLANT
COVER MAYO STAND STRL (DRAPES) ×3 IMPLANT
COVER TABLE BACK 60X90 (DRAPES) ×3 IMPLANT
CUFF TOURNIQUET SINGLE 18IN (TOURNIQUET CUFF) ×3 IMPLANT
DECANTER SPIKE VIAL GLASS SM (MISCELLANEOUS) IMPLANT
DRAPE EXTREMITY T 121X128X90 (DRAPE) ×3 IMPLANT
DRAPE SURG 17X23 STRL (DRAPES) ×3 IMPLANT
GAUZE SPONGE 4X4 12PLY STRL LF (GAUZE/BANDAGES/DRESSINGS) ×6 IMPLANT
GAUZE XEROFORM 1X8 LF (GAUZE/BANDAGES/DRESSINGS) IMPLANT
GLOVE BIO SURGEON STRL SZ 6.5 (GLOVE) ×6 IMPLANT
GLOVE BIOGEL M STRL SZ7.5 (GLOVE) ×3 IMPLANT
GLOVE BIOGEL PI IND STRL 7.0 (GLOVE) ×2 IMPLANT
GLOVE BIOGEL PI INDICATOR 7.0 (GLOVE) ×1
GLOVE ORTHO TXT STRL SZ7.5 (GLOVE) ×3 IMPLANT
GOWN PREVENTION PLUS XLARGE (GOWN DISPOSABLE) ×3 IMPLANT
GOWN PREVENTION PLUS XXLARGE (GOWN DISPOSABLE) IMPLANT
GOWN STRL REIN XL XLG (GOWN DISPOSABLE) ×6 IMPLANT
NEEDLE 27GAX1X1/2 (NEEDLE) ×3 IMPLANT
PACK BASIN DAY SURGERY FS (CUSTOM PROCEDURE TRAY) ×3 IMPLANT
PAD CAST 3X4 CTTN HI CHSV (CAST SUPPLIES) ×2 IMPLANT
PAD CAST 4YDX4 CTTN HI CHSV (CAST SUPPLIES) ×2 IMPLANT
PADDING CAST ABS 3INX4YD NS (CAST SUPPLIES) ×1
PADDING CAST ABS 4INX4YD NS (CAST SUPPLIES) ×1
PADDING CAST ABS COTTON 3X4 (CAST SUPPLIES) ×2 IMPLANT
PADDING CAST ABS COTTON 4X4 ST (CAST SUPPLIES) ×2 IMPLANT
PADDING CAST COTTON 3X4 STRL (CAST SUPPLIES) ×2
PADDING CAST COTTON 4X4 STRL (CAST SUPPLIES) ×3
SPLINT PLASTER CAST XFAST 3X15 (CAST SUPPLIES) ×2 IMPLANT
SPLINT PLASTER XTRA FASTSET 3X (CAST SUPPLIES) ×1
SPONGE GAUZE 4X4 12PLY (GAUZE/BANDAGES/DRESSINGS) ×3 IMPLANT
STOCKINETTE 4X48 STRL (DRAPES) ×3 IMPLANT
STRIP CLOSURE SKIN 1/2X4 (GAUZE/BANDAGES/DRESSINGS) ×3 IMPLANT
SUT PROLENE 3 0 PS 2 (SUTURE) ×3 IMPLANT
SYR 3ML 23GX1 SAFETY (SYRINGE) IMPLANT
SYR CONTROL 10ML LL (SYRINGE) ×3 IMPLANT
TOWEL OR 17X24 6PK STRL BLUE (TOWEL DISPOSABLE) ×3 IMPLANT
TRAY DSU PREP LF (CUSTOM PROCEDURE TRAY) ×3 IMPLANT
UNDERPAD 30X30 INCONTINENT (UNDERPADS AND DIAPERS) ×3 IMPLANT
WATER STERILE IRR 1000ML POUR (IV SOLUTION) IMPLANT

## 2012-04-20 NOTE — Anesthesia Procedure Notes (Signed)
Procedure Name: LMA Insertion Date/Time: 04/20/2012 7:54 AM Performed by: Burna Cash Pre-anesthesia Checklist: Patient identified, Emergency Drugs available, Suction available and Patient being monitored Patient Re-evaluated:Patient Re-evaluated prior to inductionOxygen Delivery Method: Circle System Utilized Preoxygenation: Pre-oxygenation with 100% oxygen Intubation Type: IV induction Ventilation: Mask ventilation without difficulty LMA: LMA inserted LMA Size: 4.0 Number of attempts: 1 Airway Equipment and Method: bite block Placement Confirmation: positive ETCO2 Tube secured with: Tape Dental Injury: Teeth and Oropharynx as per pre-operative assessment

## 2012-04-20 NOTE — Brief Op Note (Signed)
04/20/2012  8:25 AM  PATIENT:  [name]  61 y.o. female  PRE-OPERATIVE DIAGNOSIS:  RIGHT CARPAL TUNNEL SYNDROME  STS RIGHT LONG/ RING FINGERS  POST-OPERATIVE DIAGNOSIS:  RIGHT CARPAL TUNNEL SYNDROME  STS RIGHT LONG/RING FINGERS  PROCEDURE:  Procedure(s) (LRB) with comments: CARPAL TUNNEL RELEASE (Right) - RIGHT CARPAL TUNNEL RELEASE  RELEASE TRIGGER FINGER/A-1 PULLEY (Right) - RIGHT LONG A-1 RELEASE, RIGHT RING A-1 PULLEY RELEASE   SURGEON:  Surgeon(s) and Role:    * Wyn Forster., MD - Primary  PHYSICIAN ASSISTANT:   ASSISTANTS: Mallory Shirk.A-C   ANESTHESIA:   general  EBL:  Total I/O In: 100 [I.V.:100] Out: -   BLOOD ADMINISTERED:none  DRAINS: none   LOCAL MEDICATIONS USED:  XYLOCAINE   SPECIMEN:  No Specimen  DISPOSITION OF SPECIMEN:  N/A  COUNTS:  YES  TOURNIQUET:  * Missing tourniquet times found for documented tourniquets in log:  16109 *  DICTATION: .Other Dictation: Dictation Number [id number]  PLAN OF CARE: Discharge to home after PACU  PATIENT DISPOSITION:  PACU - hemodynamically stable.

## 2012-04-20 NOTE — Anesthesia Preprocedure Evaluation (Signed)
Anesthesia Evaluation  Patient identified by MRN, date of birth, ID band Patient awake    Reviewed: Allergy & Precautions, H&P , NPO status , Patient's Chart, lab work & pertinent test results  Airway Mallampati: I TM Distance: >3 FB Neck ROM: Full    Dental   Pulmonary          Cardiovascular     Neuro/Psych    GI/Hepatic GERD-  Medicated and Controlled,  Endo/Other  Hypothyroidism   Renal/GU      Musculoskeletal   Abdominal   Peds  Hematology   Anesthesia Other Findings   Reproductive/Obstetrics                           Anesthesia Physical Anesthesia Plan  ASA: II  Anesthesia Plan: General   Post-op Pain Management:    Induction: Intravenous  Airway Management Planned: LMA  Additional Equipment:   Intra-op Plan:   Post-operative Plan: Extubation in OR  Informed Consent: I have reviewed the patients History and Physical, chart, labs and discussed the procedure including the risks, benefits and alternatives for the proposed anesthesia with the patient or authorized representative who has indicated his/her understanding and acceptance.     Plan Discussed with: CRNA and Surgeon  Anesthesia Plan Comments:         Anesthesia Quick Evaluation  

## 2012-04-20 NOTE — Transfer of Care (Signed)
Immediate Anesthesia Transfer of Care Note  Patient: [name]  Procedure(s) Performed: Procedure(s) (LRB) with comments: CARPAL TUNNEL RELEASE (Right) - RIGHT CARPAL TUNNEL RELEASE  RELEASE TRIGGER FINGER/A-1 PULLEY (Right) - RIGHT LONG A-1 RELEASE, RIGHT RING A-1 PULLEY RELEASE   Patient Location: PACU  Anesthesia Type:General  Level of Consciousness: awake, alert  and oriented  Airway & Oxygen Therapy: Patient Spontanous Breathing and Patient connected to face mask oxygen  Post-op Assessment: Report given to PACU RN and Post -op Vital signs reviewed and stable  Post vital signs: Reviewed and stable  Complications: No apparent anesthesia complications

## 2012-04-20 NOTE — Anesthesia Postprocedure Evaluation (Signed)
Anesthesia Post Note  Patient: [name]  Procedure(s) Performed: Procedure(s) (LRB): CARPAL TUNNEL RELEASE (Right) RELEASE TRIGGER FINGER/A-1 PULLEY (Right)  Anesthesia type: general  Patient location: PACU  Post pain: Pain level controlled  Post assessment: Patient's Cardiovascular Status Stable  Last Vitals:  Filed Vitals:   04/20/12 0845  BP: 133/82  Pulse: 72  Temp:   Resp: 15    Post vital signs: Reviewed and stable  Level of consciousness: sedated  Complications: No apparent anesthesia complications

## 2012-04-20 NOTE — Op Note (Signed)
[id number] 

## 2012-04-21 ENCOUNTER — Encounter (HOSPITAL_BASED_OUTPATIENT_CLINIC_OR_DEPARTMENT_OTHER): Payer: Self-pay | Admitting: Orthopedic Surgery

## 2012-04-21 NOTE — Op Note (Signed)
[name], [name]            ACCOUNT NO.:  [id number]  MEDICAL RECORD NO.:  [id number]  LOCATION:                                 FACILITY:  PHYSICIAN:  Katy Fitch. Kenadie Royce, M.D. DATE OF BIRTH:  [birth year]  DATE OF PROCEDURE:  04/20/2012 DATE OF DISCHARGE:                              OPERATIVE REPORT   PREOPERATIVE DIAGNOSES: 1. Median entrapment neuropathy, right carpal tunnel. 2. Chronic stenosing tenosynovitis, right long finger A1 pulley. 3. Chronic stenosing tenosynovitis, right ring finger A1 pulley.  POSTOPERATIVE DIAGNOSES: 1. Median entrapment neuropathy, right carpal tunnel. 2. Chronic stenosing tenosynovitis, right long finger A1 pulley. 3. Chronic stenosing tenosynovitis, right ring finger A1 pulley.  IDENTIFICATION:  Significant synovitis at long and ring finger A1 pulleys primarily involving flexor digitorum superficialis tendon.  OPERATION: 1. Release of right transverse carpal ligament 2. Release of right long finger A1 pulley. 3. Release of right ring finger A1 pulley.  OPERATING SURGEON:  Katy Fitch. Pallie Swigert, MD  ASSISTANT:  Destiny Reeks [name], PA-C  ANESTHESIA:  General by LMA.  SUPERVISING ANESTHESIOLOGIST:  Destiny Layer. 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  [name] was noted to have bilateral carpal tunnel syndrome earlier this year and is status post left carpal tunnel release.  [name] has had stenosing tenosynovitis affecting the right long and ring fingers.  We have advised proceeding with release of her right transverse carpal ligament and release of the right long and ring finger A1 pulleys.  Preoperatively, [name] was reexamined in the holding area.  Clinical examination did not reveal other digits involved with stenosing tenosynovitis.  We confirmed that her long and ring fingers were still impaired.  After informed consent, [name] was brought to the operating room at  this time.  PROCEDURE:  [name] was brought to room 3 at the [facility] and placed in supine position on the operating table.  Following induction of general anesthesia by LMA technique, the right hand and arm were prepped with Betadine soap and solution, sterilely draped.  A pneumatic tourniquet was applied to the proximal right brachium.  Following exsanguination of the right arm with Esmarch bandage, arterial tourniquet was inflated to 220 mmHg.  Following routine surgical time- out, procedure commenced with a short incision in the line of the ring finger in the proximal palm.  Subcutaneous tissues were carefully divided, taking care to identify the palmar fascia.  The fascia was split in line of its fibers revealing the superficial palmar arch and the common sensory branch of the median nerve.  The carpal canal was sounded with a Insurance risk surveyor.  There was noted to be a muscle crossing the proximal portion of the transverse carpal ligament.  Muscle fibers were gently teased apart with scissors, looking for aberrant motor branches, none were identified.  The [place] 4 was easily passed into the forearm followed by meticulous release of the ulnar aspect of the transverse carpal ligament with scissors.  This widely opened carpal canal.  The ulnar bursa was fibrotic and opaque.  No mass or other predicaments were appreciated.  Bleeding points along the margin of the released ligament  were electrocauterized with bipolar current followed by repair of the skin with intradermal 3-0 Prolene suture.  Attention was then directed to the distal palm where oblique incision was fashioned directly over the palpably thickened A1 pulleys. Subcutaneous tissues were carefully divided taking care to release the pretendinous fibers of the palmar fascia to the long and ring fingers. The flexor sheath was identified of the long finger.  There was an A0 and A1 pulley.  Both  released with scissors.  The tendons delivered and superficialis tendon was found to have a significant degree of synovitis infiltrating into the peritenon.  This was carefully removed with scissors dissection and rongeur dissection.  The profundus tendon was delivered and found to have some inflammation but otherwise no infiltrating synovitis.  Attention was then directed to the ring finger where similar procedure was performed with release of A0 and A1 pulleys.  The tendons delivered and once again tenosynovium was identified that was removed with scissors and rongeur dissection.  The fingers then passively ranged.  There was no sign of residual triggering.  The wounds were repaired with intradermal 3-0 Prolene.  All 3 wounds were then anesthetized with 2% lidocaine for postoperative analgesia.  The hand was then placed in compressive dressing with a volar plaster splint, maintaining the wrist in 15 degrees of dorsiflexion.  For aftercare, [name] will begin immediate range of motion exercises.  [name] is encouraged to be as active as possible.  We will see her back for followup in 1 week for suture removal and advancement to a postoperative rehab program. Note for aftercare [name] is provided a prescription for tramadol 50 mg 1 p.o. q.4-6 hours p.r.n. pain, 20 tablets without refill.     Katy Fitch Marlisa Caridi, M.D.     RVS/MEDQ  D:  04/20/2012  T:  04/21/2012  Job:  [id number]  cc:   Loraine Leriche A. Perini, M.D.

## 2014-08-21 ENCOUNTER — Ambulatory Visit (INDEPENDENT_AMBULATORY_CARE_PROVIDER_SITE_OTHER): Payer: BLUE CROSS/BLUE SHIELD | Admitting: Podiatry

## 2014-08-21 ENCOUNTER — Encounter: Payer: Self-pay | Admitting: Podiatry

## 2014-08-21 VITALS — BP 129/80 | HR 69 | Ht 62.0 in | Wt 120.0 lb

## 2014-08-21 DIAGNOSIS — M7741 Metatarsalgia, right foot: Secondary | ICD-10-CM

## 2014-08-21 DIAGNOSIS — M7742 Metatarsalgia, left foot: Secondary | ICD-10-CM

## 2014-08-21 DIAGNOSIS — M2040 Other hammer toe(s) (acquired), unspecified foot: Secondary | ICD-10-CM | POA: Diagnosis not present

## 2014-08-21 DIAGNOSIS — M2010 Hallux valgus (acquired), unspecified foot: Secondary | ICD-10-CM

## 2014-08-21 DIAGNOSIS — M21619 Bunion of unspecified foot: Secondary | ICD-10-CM | POA: Insufficient documentation

## 2014-08-21 NOTE — Progress Notes (Signed)
Subjective: 64 year old female presents complaining of pain in bunions and midfoot through out on both feet x 2 years. Has had pain at 2nd MPJ right, but now it is not hurting bad. Wants to discuss treatment options.  Review of Systems - Negative except Hypothyroidism and taking Cholesterol medication.   Objective: Dermatologic: Hyperemic redness over the 5th MPJ and the 1st MPJ bilateral. Neurovascular status are within normal. Orthopedic: High arched cavus type foot with elevated first ray bilateral. Enlarged and lateral displaced 5th metatarsal head bilateral. Hallux valgus with bunion bilateral. Contracted 3rd digit right. Medially shifting 3rd and 4th digit right. Enlarged 2nd MPJ with stiffness right foot. Non weight bearing X-rays reviewed. Noted of sclerotic and destroyed 2nd MPJ right foot.  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  Multiple contracted lesser digits bilateral.  Plan:  Reviewed findings and available treatment options. May require Cotton osteotomy with bone graft and bunion surgery. Need custom orthotics. Patient will return to prepare for custom orthotics and X-rays.

## 2014-08-21 NOTE — Patient Instructions (Signed)
Seen for painful feet. Findings reviewed. May require Cotton osteotomy with bone graft as well as Bunionectomy on the first and 5th Metatarsals. Return for custom orthotics and X-rays.

## 2014-08-28 DIAGNOSIS — M19072 Primary osteoarthritis, left ankle and foot: Secondary | ICD-10-CM | POA: Insufficient documentation

## 2014-09-08 ENCOUNTER — Ambulatory Visit: Payer: BLUE CROSS/BLUE SHIELD | Admitting: Podiatry

## 2014-09-13 ENCOUNTER — Other Ambulatory Visit: Payer: Self-pay | Admitting: Internal Medicine

## 2014-09-13 ENCOUNTER — Ambulatory Visit
Admission: RE | Admit: 2014-09-13 | Discharge: 2014-09-13 | Disposition: A | Discharge status: Home / Self Care | Payer: BLUE CROSS/BLUE SHIELD | Source: Ambulatory Visit | Attending: Internal Medicine | Admitting: Internal Medicine

## 2014-09-13 DIAGNOSIS — M545 Low back pain: Secondary | ICD-10-CM

## 2014-09-15 ENCOUNTER — Other Ambulatory Visit: Payer: Self-pay | Admitting: Internal Medicine

## 2014-09-15 DIAGNOSIS — M545 Low back pain: Secondary | ICD-10-CM

## 2014-09-20 ENCOUNTER — Ambulatory Visit
Admission: RE | Admit: 2014-09-20 | Discharge: 2014-09-20 | Disposition: A | Discharge status: Home / Self Care | Payer: BLUE CROSS/BLUE SHIELD | Source: Ambulatory Visit | Attending: Internal Medicine | Admitting: Internal Medicine

## 2014-09-20 DIAGNOSIS — M545 Low back pain: Secondary | ICD-10-CM

## 2015-03-21 ENCOUNTER — Ambulatory Visit: Payer: BLUE CROSS/BLUE SHIELD | Admitting: Cardiovascular Disease

## 2015-03-27 ENCOUNTER — Ambulatory Visit: Payer: BLUE CROSS/BLUE SHIELD | Admitting: Cardiology

## 2015-03-28 ENCOUNTER — Encounter: Payer: Self-pay | Admitting: Cardiology

## 2015-03-28 ENCOUNTER — Ambulatory Visit (INDEPENDENT_AMBULATORY_CARE_PROVIDER_SITE_OTHER): Payer: BLUE CROSS/BLUE SHIELD | Admitting: Cardiology

## 2015-03-28 VITALS — BP 128/78 | HR 75 | Ht 62.5 in | Wt 114.8 lb

## 2015-03-28 DIAGNOSIS — R079 Chest pain, unspecified: Secondary | ICD-10-CM | POA: Diagnosis not present

## 2015-03-28 NOTE — Progress Notes (Signed)
Cardiology Office Note   Date:  03/28/2015   ID:  Destiny CrazeDarrow M Bacorn, DOB 05-Feb-1951, MRN [id number]  PCP:  Ezequiel KayserPERINI,MARK A, MD  Cardiologist:   Rollene RotundaJames Analleli Gierke, MD   Chief Complaint  Patient presents with  . Chest Pain      History of Present Illness: [name] is a 64 y.o. female who presents for evaluation of chest discomfort. She recently started working out with a trainer and noted she was getting some chest discomfort with exercise. She points to her mid chest and says that she will get some discomfort fatigue. She had to stop what she was doing. She recover and go on. However, she feels like the trainer was working very hard. She would be exhausted after each session. She's able to be otherwise very active in her life without bringing on any symptoms. She doesn't have any shortness of breath. She doesn't have any PND or orthopnea. She has no palpitations, presyncope or syncope. She has no weight gain or edema.  Of note I saw the patient 2011 for dyslipidemia and tachycardia.  She has also had a treadmill in the past which was negative.  Past Medical History  Diagnosis Date  . Hypothyroidism   . History of anemia   . Osteoarthritis   . GERD (gastroesophageal reflux disease)   . Attention deficit disorder (ADD)   . Depression   . 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 High cholesterol     Past Surgical History  Procedure Laterality Date  . Combined hysteroscopy diagnostic / d&c  06/29/2001    with resectoscopic polypectomy  . Carpal tunnel release  02/03/2012    Procedure: CARPAL TUNNEL RELEASE;  Surgeon: Wyn Forsterobert V Sypher Jr., MD;  Location: [facility];  Service: Orthopedics;  Laterality: Left;  . Carpal tunnel release  04/20/2012    Procedure: CARPAL TUNNEL RELEASE;  Surgeon: Wyn Forsterobert V Sypher Jr., MD;  Location: [facility];  Service: Orthopedics;  Laterality: Right;  RIGHT CARPAL TUNNEL RELEASE   . Trigger finger release   04/20/2012    Procedure: RELEASE TRIGGER FINGER/A-1 PULLEY;  Surgeon: Wyn Forsterobert V Sypher Jr., MD;  Location: [facility];  Service: Orthopedics;  Laterality: Right;  RIGHT LONG A-1 RELEASE, RIGHT RING A-1 PULLEY RELEASE      Current Outpatient Prescriptions  Medication Sig Dispense Refill  . ALPRAZolam (XANAX) 0.25 MG tablet Take 0.25 tablets by mouth as needed.  2  . atorvastatin (LIPITOR) 40 MG tablet Take 40 mg by mouth daily.    . butalbital-aspirin-caffeine (FIORINAL) 50-325-40 MG capsule Take 50 capsules by mouth as needed.  0  . calcium citrate-vitamin D 200-200 MG-UNIT TABS Take 1 tablet by mouth daily.    . cetirizine (ZYRTEC) 10 MG tablet Take 10 mg by mouth daily.    Marland Kitchen. levothyroxine (SYNTHROID, LEVOTHROID) 88 MCG tablet Take 88 mcg by mouth daily.    . Multiple Vitamin (MULTIVITAMIN) tablet Take 1 tablet by mouth daily.    . Omega-3 Fatty Acids (FISH OIL) 1000 MG CAPS Take 1 tablet by mouth daily.    . pantoprazole (PROTONIX) 40 MG tablet Take 40 mg by mouth daily.    . vitamin E 400 UNIT capsule Take 400 Units by mouth daily.     No current facility-administered medications for this visit.    Allergies:   Penicillins    Social History:  The patient  reports that she has quit smoking. She has never used  smokeless tobacco. She reports that she does not drink alcohol or use illicit drugs.   Family History:  The patient's family history includes Alzheimer's disease in her father.    ROS:  Please see the history of present illness.   Otherwise, review of systems are positive for recent GERD that is not treated.   All other systems are reviewed and negative.    PHYSICAL EXAM: VS:  BP 128/78 mmHg  Pulse 75  Ht 5' 2.5" (1.588 m)  Wt 114 lb 12.8 oz (52.073 kg)  BMI 20.65 kg/m2 , BMI Body mass index is 20.65 kg/(m^2). GENERAL:  Well appearing HEENT:  Pupils equal round and reactive, fundi not visualized, oral mucosa unremarkable NECK:  No jugular venous  distention, waveform within normal limits, carotid upstroke brisk and symmetric, no bruits, no thyromegaly LYMPHATICS:  No cervical, inguinal adenopathy LUNGS:  Clear to auscultation bilaterally BACK:  No CVA tenderness CHEST:  Unremarkable HEART:  PMI not displaced or sustained,S1 and S2 within normal limits, no S3, no S4, no clicks, no rubs, no murmurs ABD:  Flat, positive bowel sounds normal in frequency in pitch, no bruits, no rebound, no guarding, no midline pulsatile mass, no hepatomegaly, no splenomegaly EXT:  2 plus pulses throughout, no edema, no cyanosis no clubbing SKIN:  No rashes no nodules NEURO:  Cranial nerves II through XII grossly intact, motor grossly intact throughout PSYCH:  Cognitively intact, oriented to person place and time    EKG:  EKG is ordered today. The ekg ordered today demonstrates sinus rhythm, rate 75, axis within normal limits, intervals within normal limits, no acute ST-T wave changes.   Recent Labs: No results found for requested labs within last 365 days.     Wt Readings from Last 3 Encounters:  03/28/15 114 lb 12.8 oz (52.073 kg)  09/20/14 118 lb (53.524 kg)  08/21/14 120 lb (54.432 kg)      Other studies Reviewed: Additional studies/ records that were reviewed today include: none. Review of the above records demonstrates:  Please see elsewhere in the note.     ASSESSMENT AND PLAN:  CHEST PAIN:  She does have some discomfort with exercise. However, I think the pretest probability of obstructive coronary disease is low. I will bring the patient back for a POET (Plain Old Exercise Test). This will allow me to screen for obstructive coronary disease, risk stratify and very importantly provide a prescription for exercise.  PALPITATIONS:  She is currently not bothered by these. No change in therapy is indicated.  Current medicines are reviewed at length with the patient today.  The patient does not have concerns regarding medicines.  The  following changes have been made:  no change  Labs/ tests ordered today include:   Orders Placed This Encounter  Procedures  . Exercise Tolerance Test  . EKG 12-Lead     Disposition:   FU with me as needed.      Signed, Rollene Rotunda, MD  03/28/2015 5:23 PM    [facility]

## 2015-03-28 NOTE — Patient Instructions (Signed)
Your physician recommends that you schedule a follow-up appointment As Needed  Your physician has requested that you have an exercise tolerance test. For further information please visit [email or web address]. Please also follow instruction sheet, as given.    

## 2015-05-01 ENCOUNTER — Telehealth (HOSPITAL_COMMUNITY): Payer: Self-pay

## 2015-05-01 NOTE — Telephone Encounter (Signed)
Encounter complete. 

## 2015-05-03 ENCOUNTER — Ambulatory Visit (HOSPITAL_COMMUNITY)
Admission: RE | Admit: 2015-05-03 | Discharge: 2015-05-03 | Disposition: A | Discharge status: Home / Self Care | Payer: BLUE CROSS/BLUE SHIELD | Source: Ambulatory Visit | Attending: Cardiovascular Disease | Admitting: Cardiovascular Disease

## 2015-05-03 DIAGNOSIS — R079 Chest pain, unspecified: Secondary | ICD-10-CM

## 2015-05-03 LAB — EXERCISE TOLERANCE TEST
Estimated workload: 12.7 METS
Exercise duration (min): 10 min
Exercise duration (sec): 38 s
MPHR: 158 {beats}/min
Peak HR: 153 {beats}/min
Percent HR: 96 %
RPE: 17
Rest HR: 67 {beats}/min

## 2015-05-04 NOTE — Telephone Encounter (Signed)
Encounter complete. 

## 2015-05-16 ENCOUNTER — Telehealth: Payer: Self-pay | Admitting: Cardiology

## 2015-05-16 NOTE — Telephone Encounter (Signed)
Nothing to add. Would just observe for now.  Marcellas Marchant Swaz[place] MD, [facility] Park[facility]ACC

## 2015-05-16 NOTE — Telephone Encounter (Signed)
Pt of Dr. Antoine PocheHochrein Reports she was working out yesterday on machine requiring her to pull down - overhead bar. She was straining to pull for last rep when she felt what seemed like a "rush of blood" or "burst" of warmth on the side of her head around right ear. She reports a headache afterwards for ~7 mins. She notes she did not hit her head w/ equipment.   She reports today she feels fine.  No discoloration, surficial bruising at self-examination of scalp. No headache, dizziness, or nausea today.  Meds as listed. Noted she takes fiorinal daily which contains 325mg  ASA. Informed pt I would route to physician to advise.

## 2015-05-16 NOTE — Telephone Encounter (Signed)
Pt advised on physician recommendations and voiced understanding. She is aware she may call if new concerns or symptoms.

## 2015-05-16 NOTE — Telephone Encounter (Signed)
[name] is calling because when she was working out on yesterday , and felt something burst in her head and had a headache and wanted to speak to someone . Please call   Thanks

## 2016-09-23 ENCOUNTER — Inpatient Hospital Stay (HOSPITAL_COMMUNITY)
Admission: EM | Admit: 2016-09-23 | Discharge: 2016-09-26 | DRG: 572 | Disposition: A | Discharge status: Home / Self Care | Payer: BLUE CROSS/BLUE SHIELD | Attending: Orthopedic Surgery | Admitting: Orthopedic Surgery

## 2016-09-23 ENCOUNTER — Emergency Department (HOSPITAL_COMMUNITY): Payer: BLUE CROSS/BLUE SHIELD

## 2016-09-23 ENCOUNTER — Emergency Department (HOSPITAL_COMMUNITY): Payer: BLUE CROSS/BLUE SHIELD | Admitting: Certified Registered"

## 2016-09-23 ENCOUNTER — Encounter (HOSPITAL_COMMUNITY)
Admission: EM | Disposition: A | Discharge status: Home / Self Care | Payer: Self-pay | Source: Home / Self Care | Attending: Orthopedic Surgery

## 2016-09-23 ENCOUNTER — Encounter (HOSPITAL_COMMUNITY): Payer: Self-pay | Admitting: *Deleted

## 2016-09-23 ENCOUNTER — Ambulatory Visit: Admit: 2016-09-23 | Payer: BLUE CROSS/BLUE SHIELD | Admitting: Orthopedic Surgery

## 2016-09-23 DIAGNOSIS — W5501XA Bitten by cat, initial encounter: Secondary | ICD-10-CM

## 2016-09-23 DIAGNOSIS — L03114 Cellulitis of left upper limb: Secondary | ICD-10-CM | POA: Diagnosis not present

## 2016-09-23 DIAGNOSIS — S51832A Puncture wound without foreign body of left forearm, initial encounter: Secondary | ICD-10-CM | POA: Diagnosis present

## 2016-09-23 DIAGNOSIS — S61031A Puncture wound without foreign body of right thumb without damage to nail, initial encounter: Secondary | ICD-10-CM | POA: Diagnosis present

## 2016-09-23 DIAGNOSIS — Z880 Allergy status to penicillin: Secondary | ICD-10-CM

## 2016-09-23 DIAGNOSIS — E039 Hypothyroidism, unspecified: Secondary | ICD-10-CM | POA: Diagnosis present

## 2016-09-23 DIAGNOSIS — M7989 Other specified soft tissue disorders: Secondary | ICD-10-CM | POA: Diagnosis present

## 2016-09-23 DIAGNOSIS — S61532A Puncture wound without foreign body of left wrist, initial encounter: Secondary | ICD-10-CM | POA: Diagnosis present

## 2016-09-23 DIAGNOSIS — F329 Major depressive disorder, single episode, unspecified: Secondary | ICD-10-CM | POA: Diagnosis present

## 2016-09-23 DIAGNOSIS — Z87891 Personal history of nicotine dependence: Secondary | ICD-10-CM

## 2016-09-23 DIAGNOSIS — Z820 Family history of epilepsy and other diseases of the nervous system: Secondary | ICD-10-CM

## 2016-09-23 DIAGNOSIS — R112 Nausea with vomiting, unspecified: Secondary | ICD-10-CM | POA: Diagnosis not present

## 2016-09-23 DIAGNOSIS — S5012XA Contusion of left forearm, initial encounter: Secondary | ICD-10-CM | POA: Diagnosis present

## 2016-09-23 DIAGNOSIS — S61231A Puncture wound without foreign body of left index finger without damage to nail, initial encounter: Secondary | ICD-10-CM | POA: Diagnosis present

## 2016-09-23 DIAGNOSIS — E7800 Pure hypercholesterolemia, unspecified: Secondary | ICD-10-CM | POA: Diagnosis present

## 2016-09-23 DIAGNOSIS — Z79899 Other long term (current) drug therapy: Secondary | ICD-10-CM

## 2016-09-23 DIAGNOSIS — S61233A Puncture wound without foreign body of left middle finger without damage to nail, initial encounter: Secondary | ICD-10-CM | POA: Diagnosis present

## 2016-09-23 DIAGNOSIS — K219 Gastro-esophageal reflux disease without esophagitis: Secondary | ICD-10-CM | POA: Diagnosis present

## 2016-09-23 HISTORY — PX: I & D EXTREMITY: SHX5045

## 2016-09-23 LAB — BASIC METABOLIC PANEL
Anion gap: 8 (ref 5–15)
BUN: 16 mg/dL (ref 6–20)
CO2: 26 mmol/L (ref 22–32)
Calcium: 9.5 mg/dL (ref 8.9–10.3)
Chloride: 103 mmol/L (ref 101–111)
Creatinine, Ser: 0.84 mg/dL (ref 0.44–1.00)
GFR calc Af Amer: >60 mL/min (ref >60)
GFR calc non Af Amer: >60 mL/min (ref >60)
Glucose, Bld: 115 mg/dL — ABNORMAL HIGH (ref 65–99)
Potassium: 4.1 mmol/L (ref 3.5–5.1)
Sodium: 137 mmol/L (ref 135–145)

## 2016-09-23 LAB — CBC WITH DIFFERENTIAL/PLATELET
Basophils Absolute: 0 10*3/uL (ref 0.0–0.1)
Basophils Relative: 0 %
Eosinophils Absolute: 0 10*3/uL (ref 0.0–0.7)
Eosinophils Relative: 0 %
HCT: 43.3 % (ref 36.0–46.0)
Hemoglobin: 14.9 g/dL (ref 12.0–15.0)
Lymphocytes Relative: 5 %
Lymphs Abs: 0.6 10*3/uL — ABNORMAL LOW (ref 0.7–4.0)
MCH: 33.9 pg (ref 26.0–34.0)
MCHC: 34.4 g/dL (ref 30.0–36.0)
MCV: 98.6 fL (ref 78.0–100.0)
Monocytes Absolute: 1.2 10*3/uL — ABNORMAL HIGH (ref 0.1–1.0)
Monocytes Relative: 10 %
Neutro Abs: 9.4 10*3/uL — ABNORMAL HIGH (ref 1.7–7.7)
Neutrophils Relative %: 85 %
Platelets: 188 10*3/uL (ref 150–400)
RBC: 4.39 MIL/uL (ref 3.87–5.11)
RDW: 11.7 % (ref 11.5–15.5)
WBC: 11.3 10*3/uL — ABNORMAL HIGH (ref 4.0–10.5)

## 2016-09-23 SURGERY — IRRIGATION AND DEBRIDEMENT EXTREMITY
Anesthesia: General | Site: Hand | Laterality: Bilateral

## 2016-09-23 MED ORDER — BISACODYL 10 MG RE SUPP: 10.0000 mg | Freq: Every day | RECTAL | Status: DC | PRN

## 2016-09-23 MED ORDER — FAMOTIDINE 20 MG PO TABS: 20.0000 mg | ORAL_TABLET | Freq: Two times a day (BID) | ORAL | Status: DC | PRN

## 2016-09-23 MED ORDER — OXYCODONE HCL 5 MG/5ML PO SOLN
5.0000 mg | Freq: Once | ORAL | Status: DC | PRN
Start: 2016-09-23 — End: 2016-09-23

## 2016-09-23 MED ORDER — CIPROFLOXACIN IN D5W 400 MG/200ML IV SOLN
400.0000 mg | Freq: Two times a day (BID) | INTRAVENOUS | Status: DC
  Administered 2016-09-23 – 2016-09-26 (×6): 400 mg via INTRAVENOUS
  Filled 2016-09-23 (×6): qty 200

## 2016-09-23 MED ORDER — SODIUM CHLORIDE 0.9 % IR SOLN
Status: DC | PRN
  Administered 2016-09-23: 1000 mL

## 2016-09-23 MED ORDER — CLINDAMYCIN PHOSPHATE 900 MG/50ML IV SOLN
INTRAVENOUS | Status: AC
  Filled 2016-09-23: qty 50

## 2016-09-23 MED ORDER — METHOCARBAMOL 500 MG PO TABS
500.0000 mg | ORAL_TABLET | Freq: Four times a day (QID) | ORAL | Status: DC | PRN
  Administered 2016-09-23 – 2016-09-24 (×2): 500 mg via ORAL
  Filled 2016-09-23: qty 1

## 2016-09-23 MED ORDER — MIDAZOLAM HCL 5 MG/5ML IJ SOLN
INTRAMUSCULAR | Status: DC | PRN
  Administered 2016-09-23: 2 mg via INTRAVENOUS

## 2016-09-23 MED ORDER — SENNA 8.6 MG PO TABS
1.0000 | ORAL_TABLET | Freq: Two times a day (BID) | ORAL | Status: DC
  Administered 2016-09-24 – 2016-09-26 (×4): 8.6 mg via ORAL
  Filled 2016-09-23 (×6): qty 1

## 2016-09-23 MED ORDER — LORATADINE 10 MG PO TABS
10.0000 mg | ORAL_TABLET | Freq: Every day | ORAL | Status: DC
  Administered 2016-09-24 – 2016-09-26 (×3): 10 mg via ORAL
  Filled 2016-09-23 (×3): qty 1

## 2016-09-23 MED ORDER — ONDANSETRON HCL 4 MG/2ML IJ SOLN
INTRAMUSCULAR | Status: DC | PRN
  Administered 2016-09-23: 4 mg via INTRAVENOUS

## 2016-09-23 MED ORDER — KETOROLAC TROMETHAMINE 30 MG/ML IJ SOLN: 30.0000 mg | Freq: Once | INTRAMUSCULAR | Status: DC

## 2016-09-23 MED ORDER — METHOCARBAMOL 500 MG PO TABS
ORAL_TABLET | ORAL | Status: AC
  Filled 2016-09-23: qty 1

## 2016-09-23 MED ORDER — OXYCODONE HCL 5 MG PO TABS
5.0000 mg | ORAL_TABLET | ORAL | Status: DC | PRN
  Administered 2016-09-23 – 2016-09-25 (×3): 10 mg via ORAL
  Filled 2016-09-23 (×2): qty 2

## 2016-09-23 MED ORDER — DEXAMETHASONE SODIUM PHOSPHATE 10 MG/ML IJ SOLN
INTRAMUSCULAR | Status: DC | PRN
  Administered 2016-09-23: 10 mg via INTRAVENOUS

## 2016-09-23 MED ORDER — CLINDAMYCIN PHOSPHATE 900 MG/50ML IV SOLN
INTRAVENOUS | Status: DC | PRN
  Administered 2016-09-23: 900 mg via INTRAVENOUS

## 2016-09-23 MED ORDER — FENTANYL CITRATE (PF) 100 MCG/2ML IJ SOLN
25.0000 ug | INTRAMUSCULAR | Status: DC | PRN
  Administered 2016-09-23 (×4): 25 ug via INTRAVENOUS

## 2016-09-23 MED ORDER — CLINDAMYCIN PHOSPHATE 600 MG/50ML IV SOLN
600.0000 mg | Freq: Three times a day (TID) | INTRAVENOUS | Status: DC
  Administered 2016-09-24 – 2016-09-26 (×7): 600 mg via INTRAVENOUS
  Filled 2016-09-23 (×8): qty 50

## 2016-09-23 MED ORDER — PANTOPRAZOLE SODIUM 40 MG PO TBEC
40.0000 mg | DELAYED_RELEASE_TABLET | Freq: Every day | ORAL | Status: DC
  Administered 2016-09-24 – 2016-09-26 (×3): 40 mg via ORAL
  Filled 2016-09-23 (×3): qty 1

## 2016-09-23 MED ORDER — PROPOFOL 10 MG/ML IV BOLUS
INTRAVENOUS | Status: AC
  Filled 2016-09-23: qty 20

## 2016-09-23 MED ORDER — LACTATED RINGERS IV SOLN
INTRAVENOUS | Status: DC
  Administered 2016-09-23 (×2): via INTRAVENOUS

## 2016-09-23 MED ORDER — LEVOTHYROXINE SODIUM 100 MCG PO TABS
100.0000 ug | ORAL_TABLET | Freq: Every day | ORAL | Status: DC
  Administered 2016-09-24 – 2016-09-26 (×3): 100 ug via ORAL
  Filled 2016-09-23 (×3): qty 1

## 2016-09-23 MED ORDER — FENTANYL CITRATE (PF) 100 MCG/2ML IJ SOLN
INTRAMUSCULAR | Status: AC
  Administered 2016-09-23: 25 ug via INTRAVENOUS
  Filled 2016-09-23: qty 2

## 2016-09-23 MED ORDER — MORPHINE SULFATE (PF) 2 MG/ML IV SOLN
2.0000 mg | INTRAVENOUS | Status: DC | PRN
Start: 2016-09-23 — End: 2016-09-25

## 2016-09-23 MED ORDER — POTASSIUM CHLORIDE IN NACL 20-0.45 MEQ/L-% IV SOLN
INTRAVENOUS | Status: DC
  Administered 2016-09-24 – 2016-09-25 (×2): via INTRAVENOUS
  Filled 2016-09-23 (×3): qty 1000

## 2016-09-23 MED ORDER — OXYCODONE HCL 5 MG PO TABS: 5.0000 mg | ORAL_TABLET | Freq: Once | ORAL | Status: DC | PRN

## 2016-09-23 MED ORDER — PROPOFOL 10 MG/ML IV BOLUS
INTRAVENOUS | Status: DC | PRN
  Administered 2016-09-23: 130 mg via INTRAVENOUS

## 2016-09-23 MED ORDER — ALPRAZOLAM 0.5 MG PO TABS: 0.5000 mg | ORAL_TABLET | Freq: Three times a day (TID) | ORAL | Status: DC | PRN

## 2016-09-23 MED ORDER — FENTANYL CITRATE (PF) 250 MCG/5ML IJ SOLN
INTRAMUSCULAR | Status: AC
  Filled 2016-09-23: qty 5

## 2016-09-23 MED ORDER — FENTANYL CITRATE (PF) 100 MCG/2ML IJ SOLN
INTRAMUSCULAR | Status: DC | PRN
  Administered 2016-09-23 (×2): 25 ug via INTRAVENOUS
  Administered 2016-09-23: 50 ug via INTRAVENOUS

## 2016-09-23 MED ORDER — MIDAZOLAM HCL 2 MG/2ML IJ SOLN
INTRAMUSCULAR | Status: AC
  Filled 2016-09-23: qty 2

## 2016-09-23 MED ORDER — MAGNESIUM CITRATE PO SOLN: 1.0000 | Freq: Once | ORAL | Status: DC | PRN

## 2016-09-23 MED ORDER — POLYETHYLENE GLYCOL 3350 17 G PO PACK: 17.0000 g | PACK | Freq: Every day | ORAL | Status: DC | PRN

## 2016-09-23 MED ORDER — METHOCARBAMOL 1000 MG/10ML IJ SOLN
500.0000 mg | Freq: Four times a day (QID) | INTRAVENOUS | Status: DC | PRN
  Filled 2016-09-23: qty 5

## 2016-09-23 MED ORDER — VITAMIN C 500 MG PO TABS
1000.0000 mg | ORAL_TABLET | Freq: Every day | ORAL | Status: DC
  Filled 2016-09-23 (×3): qty 2

## 2016-09-23 MED ORDER — DOCUSATE SODIUM 100 MG PO CAPS
100.0000 mg | ORAL_CAPSULE | Freq: Two times a day (BID) | ORAL | Status: DC
  Administered 2016-09-23 – 2016-09-26 (×5): 100 mg via ORAL
  Filled 2016-09-23 (×6): qty 1

## 2016-09-23 MED ORDER — DIPHENHYDRAMINE HCL 25 MG PO CAPS: 25.0000 mg | ORAL_CAPSULE | Freq: Four times a day (QID) | ORAL | Status: DC | PRN

## 2016-09-23 MED ORDER — KETOROLAC TROMETHAMINE 30 MG/ML IJ SOLN
30.0000 mg | Freq: Once | INTRAMUSCULAR | Status: AC
  Administered 2016-09-23: 30 mg via INTRAVENOUS
  Filled 2016-09-23: qty 1

## 2016-09-23 MED ORDER — PROMETHAZINE HCL 25 MG RE SUPP
12.5000 mg | Freq: Four times a day (QID) | RECTAL | Status: DC | PRN
  Administered 2016-09-24: 12.5 mg via RECTAL
  Filled 2016-09-23: qty 1

## 2016-09-23 MED ORDER — ATORVASTATIN CALCIUM 40 MG PO TABS
40.0000 mg | ORAL_TABLET | Freq: Every day | ORAL | Status: DC
  Administered 2016-09-24 – 2016-09-26 (×3): 40 mg via ORAL
  Filled 2016-09-23 (×3): qty 1

## 2016-09-23 MED ORDER — ONDANSETRON HCL 4 MG PO TABS: 4.0000 mg | ORAL_TABLET | Freq: Four times a day (QID) | ORAL | Status: DC | PRN

## 2016-09-23 MED ORDER — ONDANSETRON HCL 4 MG/2ML IJ SOLN
4.0000 mg | Freq: Four times a day (QID) | INTRAMUSCULAR | Status: DC | PRN
  Administered 2016-09-24 – 2016-09-25 (×2): 4 mg via INTRAVENOUS
  Filled 2016-09-23 (×2): qty 2

## 2016-09-23 MED ORDER — SODIUM CHLORIDE 0.9 % IR SOLN
Status: DC | PRN
  Administered 2016-09-23: 3000 mL

## 2016-09-23 MED ORDER — OXYCODONE HCL 5 MG PO TABS
ORAL_TABLET | ORAL | Status: AC
  Filled 2016-09-23: qty 2

## 2016-09-23 SURGICAL SUPPLY — 41 items
BANDAGE ACE 4X5 VEL STRL LF (GAUZE/BANDAGES/DRESSINGS) ×2 IMPLANT
BANDAGE ELASTIC 4 VELCRO ST LF (GAUZE/BANDAGES/DRESSINGS) ×2 IMPLANT
BNDG COHESIVE 3X5 TAN STRL LF (GAUZE/BANDAGES/DRESSINGS) ×2 IMPLANT
BNDG CONFORM 2 STRL LF (GAUZE/BANDAGES/DRESSINGS) ×2 IMPLANT
BNDG GAUZE ELAST 4 BULKY (GAUZE/BANDAGES/DRESSINGS) ×2 IMPLANT
CORDS BIPOLAR (ELECTRODE) ×2 IMPLANT
CUFF TOURNIQUET SINGLE 18IN (TOURNIQUET CUFF) ×2 IMPLANT
CUFF TOURNIQUET SINGLE 24IN (TOURNIQUET CUFF) IMPLANT
DRSG ADAPTIC 3X8 NADH LF (GAUZE/BANDAGES/DRESSINGS) ×2 IMPLANT
GAUZE PACKING IODOFORM 1/4X15 (GAUZE/BANDAGES/DRESSINGS) ×2 IMPLANT
GAUZE SPONGE 4X4 12PLY STRL (GAUZE/BANDAGES/DRESSINGS) ×4 IMPLANT
GAUZE XEROFORM 1X8 LF (GAUZE/BANDAGES/DRESSINGS) IMPLANT
GAUZE XEROFORM 5X9 LF (GAUZE/BANDAGES/DRESSINGS) ×4 IMPLANT
GLOVE BIOGEL M 8.0 STRL (GLOVE) IMPLANT
GLOVE SS BIOGEL STRL SZ 8 (GLOVE) ×1 IMPLANT
GLOVE SUPERSENSE BIOGEL SZ 8 (GLOVE) ×1
GOWN STRL REUS W/ TWL LRG LVL3 (GOWN DISPOSABLE) ×1 IMPLANT
GOWN STRL REUS W/ TWL XL LVL3 (GOWN DISPOSABLE) ×2 IMPLANT
GOWN STRL REUS W/TWL LRG LVL3 (GOWN DISPOSABLE) ×2
GOWN STRL REUS W/TWL XL LVL3 (GOWN DISPOSABLE) ×2
HANDPIECE INTERPULSE COAX TIP (DISPOSABLE)
KIT BASIN OR (CUSTOM PROCEDURE TRAY) ×2 IMPLANT
KIT ROOM TURNOVER OR (KITS) ×2 IMPLANT
MANIFOLD NEPTUNE II (INSTRUMENTS) ×2 IMPLANT
NEEDLE HYPO 25GX1X1/2 BEV (NEEDLE) IMPLANT
NS IRRIG 1000ML POUR BTL (IV SOLUTION) ×2 IMPLANT
PACK ORTHO EXTREMITY (CUSTOM PROCEDURE TRAY) ×2 IMPLANT
PAD ARMBOARD 7.5X6 YLW CONV (MISCELLANEOUS) ×2 IMPLANT
PAD CAST 4YDX4 CTTN HI CHSV (CAST SUPPLIES) ×1 IMPLANT
PADDING CAST COTTON 4X4 STRL (CAST SUPPLIES) ×2
SCRUB BETADINE 4OZ XXX (MISCELLANEOUS) ×4 IMPLANT
SET HNDPC FAN SPRY TIP SCT (DISPOSABLE) IMPLANT
SOL PREP POV-IOD 4OZ 10% (MISCELLANEOUS) ×2 IMPLANT
SPONGE LAP 4X18 X RAY DECT (DISPOSABLE) IMPLANT
SWAB CULTURE ESWAB REG 1ML (MISCELLANEOUS) ×2 IMPLANT
SYR CONTROL 10ML LL (SYRINGE) IMPLANT
TOWEL OR 17X24 6PK STRL BLUE (TOWEL DISPOSABLE) ×2 IMPLANT
TOWEL OR 17X26 10 PK STRL BLUE (TOWEL DISPOSABLE) ×2 IMPLANT
TUBE CONNECTING 12X1/4 (SUCTIONS) ×2 IMPLANT
WATER STERILE IRR 1000ML POUR (IV SOLUTION) ×2 IMPLANT
YANKAUER SUCT BULB TIP NO VENT (SUCTIONS) ×2 IMPLANT

## 2016-09-23 NOTE — ED Notes (Signed)
Report given to OR , pt transported on str

## 2016-09-23 NOTE — ED Notes (Signed)
Spoke to [employer] per or nurse ok to give toredol

## 2016-09-23 NOTE — Transfer of Care (Signed)
Immediate Anesthesia Transfer of Care Note  Patient: [name]  Procedure(s) Performed: Procedure(s): IRRIGATION AND DEBRIDEMENT BILATERAL ARMS (Bilateral)  Patient Location: PACU  Anesthesia Type:General  Level of Consciousness: awake, patient cooperative and responds to stimulation  Airway & Oxygen Therapy: Patient Spontanous Breathing and Patient connected to nasal cannula oxygen  Post-op Assessment: Report given to RN and Post -op Vital signs reviewed and stable  Post vital signs: Reviewed and stable  Last Vitals:  Vitals:   09/23/16 1357 09/23/16 2107  BP: (!) 137/98   Pulse: 100   Resp: 16   Temp: 37.3 C (P) 36.1 C    Last Pain:  Vitals:   09/23/16 2107  TempSrc:   PainSc: (P) 0-No pain         Complications: No apparent anesthesia complications

## 2016-09-23 NOTE — ED Notes (Signed)
Back from xray states dr Amanda Pea coming to see her

## 2016-09-23 NOTE — Op Note (Signed)
See full dictation [id number]  Patient status post multiple cat bites to left forearm wrist and hand including the index finger. Drains are placed. She also has a right thumb Bite in 2 locations quite deep.  We will plan for clindamycin and Cipro given her penicillin allergy.  We'll plan to remove her drains and 24-36 hours. I would give her variable prognosis. We'll have see how she does given the depth and location of multiple bites  All questions were discussed with the family  Antoinett Dorman M.D.

## 2016-09-23 NOTE — Anesthesia Preprocedure Evaluation (Signed)
Anesthesia Evaluation  Patient identified by MRN, date of birth, ID band Patient awake    Reviewed: Allergy & Precautions, NPO status , Patient's Chart, lab work & pertinent test results  Airway Mallampati: II  TM Distance: >3 FB Neck ROM: Full    Dental  (+) Teeth Intact   Pulmonary former smoker,    breath sounds clear to auscultation       Cardiovascular negative cardio ROS   Rhythm:Regular     Neuro/Psych PSYCHIATRIC DISORDERS Depression negative neurological ROS     GI/Hepatic Neg liver ROS, GERD  Controlled,  Endo/Other  negative endocrine ROSHypothyroidism   Renal/GU   negative genitourinary   Musculoskeletal  (+) Arthritis ,   Abdominal   Peds negative pediatric ROS (+)  Hematology negative hematology ROS (+)   Anesthesia Other Findings   Reproductive/Obstetrics negative OB ROS                             Anesthesia Physical Anesthesia Plan  ASA: II  Anesthesia Plan: General   Post-op Pain Management:    Induction: Intravenous  Airway Management Planned: LMA  Additional Equipment: None  Intra-op Plan:   Post-operative Plan: Extubation in OR  Informed Consent: I have reviewed the patients History and Physical, chart, labs and discussed the procedure including the risks, benefits and alternatives for the proposed anesthesia with the patient or authorized representative who has indicated his/her understanding and acceptance.   Dental advisory given  Plan Discussed with: CRNA and Surgeon  Anesthesia Plan Comments:         Anesthesia Quick Evaluation

## 2016-09-23 NOTE — ED Notes (Addendum)
Multiple cat bites and scratches to both hands and arms left worse than rt, some oozing pt states got tetanus shot this am at the urgent care

## 2016-09-23 NOTE — ED Provider Notes (Signed)
[facility]-EMERGENCY DEPT Provider Note   CSN: [id number] Arrival date & time: 09/23/16  1338  By signing my name below, I, Cynda Acres, attest that this documentation has been prepared under the direction and in the presence of Graciella Freer, [place]. Electronically Signed: Cynda Acres, Scribe. 09/23/16. 3:15 PM.  History   Chief Complaint Chief Complaint  Patient presents with  . Animal Bite   HPI Comments: [name] is a 66 y.o. female with no pertinent medical history, who presents to the Emergency Department complaining of worsening left finger pain and swelling that began after a cat bite this morning. Patient states that this morning she was bitten multiple times by her car. She initially went to Urgent Care where she received wound care, a tetanus shot and discharged on doxycycline. Patient noted that when she went home, she started having worsening pain and swelling of her 2nd and 3rd digits of her left hand, prompting ED visit. She also noted some oozing to the puncture wounds of her forearm. Patient states that her pain is worsened with movement of the fingers. Tetanus was given at Urgent Care this morning. Patient denies any numbness, weakness, fever, chills or any other symptoms.    The history is provided by the patient. No language interpreter was used.    Past Medical History:  Diagnosis Date  . Attention deficit disorder (ADD)   . 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 Depression   . GERD (gastroesophageal reflux disease)   . High cholesterol   . History of anemia   . Hypothyroidism   . Osteoarthritis     Patient Active Problem List   Diagnosis Date Noted  . Metatarsalgia of both feet 08/21/2014  . Bunion 08/21/2014  . Hammer toe 08/21/2014  . HYPERCHOLESTEROLEMIA 04/18/2010  . UNSPECIFIED TACHYCARDIA 04/18/2010    Past Surgical History:  Procedure Laterality Date  . CARPAL TUNNEL RELEASE  02/03/2012   Procedure: CARPAL TUNNEL RELEASE;   Surgeon: Wyn Forster., MD;  Location:  [facility];  Service: Orthopedics;  Laterality: Left;  . CARPAL TUNNEL RELEASE  04/20/2012   Procedure: CARPAL TUNNEL RELEASE;  Surgeon: Wyn Forster., MD;  Location: [facility];  Service: Orthopedics;  Laterality: Right;  RIGHT CARPAL TUNNEL RELEASE   . COMBINED HYSTEROSCOPY DIAGNOSTIC / D&C  06/29/2001   with resectoscopic polypectomy  . TRIGGER FINGER RELEASE  04/20/2012   Procedure: RELEASE TRIGGER FINGER/A-1 PULLEY;  Surgeon: Wyn Forster., MD;  Location: [facility];  Service: Orthopedics;  Laterality: Right;  RIGHT LONG A-1 RELEASE, RIGHT RING A-1 PULLEY RELEASE     OB History    No data available       Home Medications    Prior to Admission medications   Medication Sig Start Date End Date Taking? Authorizing Provider  ALPRAZolam Prudy Feeler) 0.25 MG tablet Take 0.25 tablets by mouth as needed. 03/08/15   Historical Provider, MD  atorvastatin (LIPITOR) 40 MG tablet Take 40 mg by mouth daily.    Historical Provider, MD  butalbital-aspirin-caffeine [facility]) 50-325-40 MG capsule Take 50 capsules by mouth as needed. 03/26/15   Historical Provider, MD  calcium citrate-vitamin D 200-200 MG-UNIT TABS Take 1 tablet by mouth daily.    Historical Provider, MD  cetirizine (ZYRTEC) 10 MG tablet Take 10 mg by mouth daily.    Historical Provider, MD  levothyroxine (SYNTHROID, LEVOTHROID) 88 MCG tablet Take 88 mcg by mouth daily.    Historical Provider,  MD  Multiple Vitamin (MULTIVITAMIN) tablet Take 1 tablet by mouth daily.    Historical Provider, MD  Omega-3 Fatty Acids (FISH OIL) 1000 MG CAPS Take 1 tablet by mouth daily.    Historical Provider, MD  pantoprazole (PROTONIX) 40 MG tablet Take 40 mg by mouth daily.    Historical Provider, MD  vitamin E 400 UNIT capsule Take 400 Units by mouth daily.    Historical Provider, MD    Family History Family History  Problem Relation Age of Onset  .  Alzheimer's disease Father     Social History Social History  Substance Use Topics  . Smoking status: Former Games developer  . Smokeless tobacco: Never Used     Comment: Quit 1983  . Alcohol use No     Allergies   Penicillins   Review of Systems Review of Systems  Constitutional: Negative for chills and fever.  Respiratory: Negative for cough and shortness of breath.   Cardiovascular: Negative for chest pain.  Gastrointestinal: Negative for abdominal pain, constipation, diarrhea, nausea and vomiting.  Genitourinary: Negative for dysuria and hematuria.  Musculoskeletal: Positive for arthralgias (left hand ) and joint swelling (left hand/fingers ).  Skin:       Scratches and puncture wounds to the left hand/arm.   Neurological: Negative for headaches.  All other systems reviewed and are negative.    Physical Exam Updated Vital Signs BP (!) 137/98 (BP Location: Right Arm)   Pulse 100   Temp 99.2 F (37.3 C) (Oral)   Resp 16   SpO2 97%   Physical Exam  Constitutional: She is oriented to person, place, and time. She appears well-developed and well-nourished.  HENT:  Head: Normocephalic and atraumatic.  Eyes: Conjunctivae, EOM and lids are normal. Pupils are equal, round, and reactive to light.  Neck: Full passive range of motion without pain.  Cardiovascular: Normal rate.   Pulmonary/Chest: Effort normal and breath sounds normal.  Musculoskeletal: Normal range of motion.  Diffuse edema and tenderness to palpation along the flexor tendon of the left 2nd and 3rd digits. Decreased ROM and pain with extension of left 2nd and 3rd digits. FROM of left thumb, 4th and 5th digits, and wrist.  Tenderness to palpation to the left forearm. Moderate hematoma to the anterior aspect of the left forearm underlying several puncture wounds.   Neurological: She is alert and oriented to person, place, and time.  Skin: Skin is warm and dry. Capillary refill takes less than 2 seconds.  Multiple  puncture wounds to the left forearm. Multiple puncture wounds to the left wrist and 2nd and 3rd digits of the left hand. Puncture wounds to the right thumb.   Psychiatric: She has a normal mood and affect.  Nursing note and vitals reviewed.    ED Treatments / Results  DIAGNOSTIC STUDIES: Oxygen Saturation is 97% on RA, normal by my interpretation.    COORDINATION OF CARE: 3:13 PM Discussed treatment plan with pt at bedside and pt agreed to plan, which includes a left arm x-ray and irrigation of the wounds.   Labs (all labs ordered are listed, but only abnormal results are displayed) Labs Reviewed  CBC WITH DIFFERENTIAL/PLATELET - Abnormal; Notable for the following:       Result Value   WBC 11.3 (*)    Neutro Abs 9.4 (*)    Lymphs Abs 0.6 (*)    Monocytes Absolute 1.2 (*)    All other components within normal limits  BASIC METABOLIC PANEL - Abnormal; Notable for  the following:    Glucose, Bld 115 (*)    All other components within normal limits    EKG  EKG Interpretation  Date/Time:  Tuesday September 23 2016 17:42:52 EDT Ventricular Rate:  79 PR Interval:    QRS Duration: 98 QT Interval:  387 QTC Calculation: 444 R Axis:   71 Text Interpretation:  Sinus rhythm Borderline short PR interval Nonspecific T abnormalities, diffuse leads No old tracing to compare Confirmed by BELFI  MD, MELANIE ([id number]) on 09/23/2016 5:53:10 PM       Radiology Dg Forearm Left  Result Date: 09/23/2016 CLINICAL DATA:  Pain following cat bite and scratch EXAM: LEFT FOREARM - 2 VIEW COMPARISON:  None. FINDINGS: Frontal and lateral views were obtained. No fracture or dislocation. No radiopaque foreign body or soft tissue air. There is mild osteoarthritic change in the radiocarpal joint. No elbow joint effusion. The pronator quadratus fat pad is not elevated. Subchondral cysts are noted in the proximal lunate bone. IMPRESSION: No bony destruction or erosion. No fracture or dislocation. No soft tissue air  or radiopaque foreign body. Osteoarthritic change noted in the radiocarpal joint. Electronically Signed   By: Bretta Bang III M.D.   On: 09/23/2016 16:14   Dg Hand Complete Left  Result Date: 09/23/2016 CLINICAL DATA:  Cat bite EXAM: LEFT HAND - COMPLETE 3+ VIEW COMPARISON:  None. FINDINGS: Frontal, oblique, and lateral views were obtained. There is soft tissue swelling dorsally over the distal metacarpals. There is also soft tissue swelling of the second digit. There is no soft tissue air or radiopaque foreign body. There is no evident fracture or dislocation. No erosive change or bony destruction. There is moderate osteoarthritic change in the scaphotrapezial joint. There is also osteoarthritic change in the first carpal -metacarpal joint. IMPRESSION: Soft tissue swelling involving the second digit as well as the dorsal aspect of the mid hand at the distal metacarpal levels. No radiopaque foreign body or soft tissue air. No fracture or dislocation. No erosive change or bony destruction. Osteoarthritic change, most notably in the scaphotrapezial joint. Electronically Signed   By: Bretta Bang III M.D.   On: 09/23/2016 16:13   Dg Finger Thumb Right  Result Date: 09/23/2016 CLINICAL DATA:  Cat bite and scratch EXAM: RIGHT THUMB 2+V COMPARISON:  None. FINDINGS: Frontal, oblique, and lateral views were obtained. There is no demonstrable soft tissue air or radiopaque foreign body. No fracture or dislocation. There is osteoarthritic change in the first carpal -metacarpal joint. Other joint spaces appear unremarkable. No erosive change or bony destruction. IMPRESSION: Osteoarthritic change in the first carpal -metacarpal joint. No erosive change or bony destruction. No fracture or dislocation. No radiopaque foreign body or soft tissue air. Electronically Signed   By: Bretta Bang III M.D.   On: 09/23/2016 16:16    Procedures Procedures (including critical care time)  Medications Ordered in  ED Medications  lactated ringers infusion ( Intravenous New Bag/Given 09/23/16 2001)  ketorolac (TORADOL) 30 MG/ML injection 30 mg (30 mg Intravenous Given 09/23/16 1858)     Initial Impression / Assessment and Plan / ED Course  I have reviewed the triage vital signs and the nursing notes.  Pertinent labs & imaging results that were available during my care of the patient were reviewed by me and considered in my medical decision making (see chart for details).      66 y.o. Female presents with worsening pain and swelling to her left hand after a cat bite that occurred this  morning. Patient was seen at urgent care this morning after she initially was bitten by her cat on the left hand. At urgent care she received wound care, tetanus shot, and was discharged home on doxycycline. When she went home she noticed that her left second and third digits were becoming increasingly swollen and painful, prompting further ED visit. On ED arrival, patient has multiple puncture wounds to the left forearm and left wrist. She also has a puncture wound to the right thumb. Physical exam shows diffuse edema to second and third digits with tenderness to palpation along the flexor tendon. She's holding left second and third digits in flexion and reports pain with extension of those fingers. She has good movement of digits left thumb and digits 4-5 on left hand. Full range of motion of the left wrist. Concern for cellulitis versus tenosynovitis versus fracture. X-rays were not obtained at urgent care. Will plan to get an x-ray of her left hand and forearm and right thumb here in the Emergency Dept. Dr. Amanda Pea (on call for hand) was consulted by Urgent Care and is aware of the patient. He was paged on patient's ED arrival. Offered patient analgesics but she declines at this time.   XRs reviewed. Negative for any acute fracture or foreign body. Discussed results with patient. Explained that we will for Dr. Carlos Levering  recommendation before proceeding.   3:57 PM: Discussed with Dr. Carlos Levering office. He will come to see the patient in the Emergency Department. Patient updated on plan. Will plan to get preadmission labs in case she has to go to the OR. Patient reports her last NPO was at 12pm prior to onset of arrival. Instructed patient to remain NPO. Will obtain CBC, BMP, EKG.   6:15 PM: Discussed with Dr. Amanda Pea after evaluation of the patient. He will admit the patient and take her to the OR tonight for a washout and further surgical evaluation. He would like a CXR done in the department prior to admission. Patient updated on plan.   Plan to admit to Dr. Amanda Pea.   Final Clinical Impressions(s) / ED Diagnoses   Final diagnoses:  Cat bite  Cat bite, initial encounter  Cellulitis of left upper extremity    New Prescriptions Current Discharge Medication List    I personally performed the services described in this documentation, which was scribed in my presence. The recorded information has been reviewed and is accurate.     Maxwell Caul, PA-C 09/23/16 2033    Rolan Bucco, MD 09/23/16 [phone]

## 2016-09-23 NOTE — ED Triage Notes (Signed)
pt states that she has multiple puncture wounds to her hand from a cat bite this morning. Pt reports that the cat was up to date on vaccines.

## 2016-09-23 NOTE — Progress Notes (Signed)
Report given to Darliene, RN

## 2016-09-24 ENCOUNTER — Encounter (HOSPITAL_COMMUNITY): Payer: Self-pay | Admitting: Orthopedic Surgery

## 2016-09-24 DIAGNOSIS — S5012XA Contusion of left forearm, initial encounter: Secondary | ICD-10-CM | POA: Diagnosis present

## 2016-09-24 DIAGNOSIS — S51832A Puncture wound without foreign body of left forearm, initial encounter: Secondary | ICD-10-CM | POA: Diagnosis present

## 2016-09-24 DIAGNOSIS — Z820 Family history of epilepsy and other diseases of the nervous system: Secondary | ICD-10-CM | POA: Diagnosis not present

## 2016-09-24 DIAGNOSIS — S61532A Puncture wound without foreign body of left wrist, initial encounter: Secondary | ICD-10-CM | POA: Diagnosis present

## 2016-09-24 DIAGNOSIS — S61031A Puncture wound without foreign body of right thumb without damage to nail, initial encounter: Secondary | ICD-10-CM | POA: Diagnosis present

## 2016-09-24 DIAGNOSIS — L03114 Cellulitis of left upper limb: Secondary | ICD-10-CM | POA: Diagnosis present

## 2016-09-24 DIAGNOSIS — R112 Nausea with vomiting, unspecified: Secondary | ICD-10-CM | POA: Diagnosis not present

## 2016-09-24 DIAGNOSIS — S61233A Puncture wound without foreign body of left middle finger without damage to nail, initial encounter: Secondary | ICD-10-CM | POA: Diagnosis present

## 2016-09-24 DIAGNOSIS — F329 Major depressive disorder, single episode, unspecified: Secondary | ICD-10-CM | POA: Diagnosis present

## 2016-09-24 DIAGNOSIS — E7800 Pure hypercholesterolemia, unspecified: Secondary | ICD-10-CM | POA: Diagnosis present

## 2016-09-24 DIAGNOSIS — Z87891 Personal history of nicotine dependence: Secondary | ICD-10-CM | POA: Diagnosis not present

## 2016-09-24 DIAGNOSIS — E039 Hypothyroidism, unspecified: Secondary | ICD-10-CM | POA: Diagnosis present

## 2016-09-24 DIAGNOSIS — K219 Gastro-esophageal reflux disease without esophagitis: Secondary | ICD-10-CM | POA: Diagnosis present

## 2016-09-24 DIAGNOSIS — M7989 Other specified soft tissue disorders: Secondary | ICD-10-CM | POA: Diagnosis present

## 2016-09-24 DIAGNOSIS — S61231A Puncture wound without foreign body of left index finger without damage to nail, initial encounter: Secondary | ICD-10-CM | POA: Diagnosis present

## 2016-09-24 DIAGNOSIS — W5501XA Bitten by cat, initial encounter: Secondary | ICD-10-CM | POA: Diagnosis not present

## 2016-09-24 DIAGNOSIS — Z79899 Other long term (current) drug therapy: Secondary | ICD-10-CM | POA: Diagnosis not present

## 2016-09-24 DIAGNOSIS — Z880 Allergy status to penicillin: Secondary | ICD-10-CM | POA: Diagnosis not present

## 2016-09-24 MED ORDER — KETOROLAC TROMETHAMINE 30 MG/ML IJ SOLN
30.0000 mg | Freq: Once | INTRAMUSCULAR | Status: AC
  Administered 2016-09-24: 30 mg via INTRAVENOUS
  Filled 2016-09-24: qty 1

## 2016-09-24 MED ORDER — TRAMADOL HCL 50 MG PO TABS
50.0000 mg | ORAL_TABLET | Freq: Four times a day (QID) | ORAL | Status: DC | PRN
  Administered 2016-09-24: 50 mg via ORAL
  Administered 2016-09-24: 100 mg via ORAL
  Filled 2016-09-24: qty 2
  Filled 2016-09-24: qty 1

## 2016-09-24 NOTE — Anesthesia Postprocedure Evaluation (Addendum)
Anesthesia Post Note  Patient: TAMBI THOLE  Procedure(s) Performed: Procedure(s) (LRB): IRRIGATION AND DEBRIDEMENT BILATERAL ARMS (Bilateral)  Patient location during evaluation: PACU Anesthesia Type: General Level of consciousness: awake and alert Pain management: pain level controlled Vital Signs Assessment: post-procedure vital signs reviewed and stable Respiratory status: spontaneous breathing, nonlabored ventilation, respiratory function stable and patient connected to nasal cannula oxygen Cardiovascular status: blood pressure returned to baseline and stable Postop Assessment: no signs of nausea or vomiting Anesthetic complications: no       Last Vitals:  Vitals:   09/23/16 2238 09/24/16 0125  BP: 122/76 112/60  Pulse: 67 71  Resp: 16 16  Temp: 36.9 C 36.8 C    Last Pain:  Vitals:   09/24/16 0125  TempSrc: Oral  PainSc:                  Doug Bucklin

## 2016-09-24 NOTE — Op Note (Signed)
NAME:  [name], [name] NO.:  MEDICAL RECORD NO.:  [id number]  LOCATION:                                 FACILITY:  PHYSICIAN:  Dionne Ano. Amanda Pea, M.D.     DATE OF BIRTH:  DATE OF PROCEDURE: DATE OF DISCHARGE:                              OPERATIVE REPORT   PREOPERATIVE DIAGNOSES: 1. Multiple left forearm cat bites. 2. Multiple left hand cat bites. 3. Multiple index finger left upper extremity cat bites. 4. Right thumb cat bites.  POSTOPERATIVE DIAGNOSES: 1. Multiple left forearm cat bites. 2. Multiple left hand cat bites. 3. Multiple index finger left upper extremity cat bites. 4. Right thumb cat bites.  PROCEDURE: 1. Irrigation and debridement, right thumb cat bites x2.  This was     skin and subcutaneous tissue irrigations with an excisional     debridement. 2. EPL tenolysis limited in nature right thumb. 3. Left forearm cat bite, irrigation and debridement of skin and     subcutaneous tissue, x4 separate 1 cm bites. 4. Irrigation and debridement, left hand cat bites x4 separate bites     including skin and subcutaneous tissue.  This was a debridement     about the hand with left middle finger extensor apparatus being     exposed. 5. Left middle finger EDC tenolysis. 6. Left index finger irrigation and debridement x3 areas where deep     cat bites were noted.  SURGEON:  Dionne Ano. Amanda Pea, MD.  ASSISTANT:  None.  COMPLICATIONS:  None.  ANESTHESIA:  General.  TOURNIQUET TIME:  Zero.  INDICATIONS:  Multiple cat bites to the hand.  She went to an urgent care, was given antibiotics.  She did not improve, but worsened.  She has red streaking, worsening pain and other problems.  Due to this, I have discussed the risks and benefits of surgery and plans to proceed to the operative theater with admission.  Given her penicillin allergy, we will plan for clindamycin and Cipro.  OPERATION IN DETAIL:  The patient was seen by myself and  Anesthesia. Given the general anesthetic, prepped and draped in usual sterile fashion with Betadine scrub and paint.  Previous to this, I performed 2 separate Hibiclens scrubs myself.  Following securing a sterile field, I performed irrigation and debridement of 4 different forearm lacerations. The forearm lacerations underwent I and D of skin and subcutaneous tissue with curette, knife blade, and scissor.  These were packed with Iodoform at the end of the case.  Following this, I performed I and D of 4 different wounds about the dorsal hand and 3 different wounds about the index finger, left hand.  I performed a limited extensor tenolysis, tenosynovectomy about the middle finger, and there were no complicating features.  Following this, I noted that the radial bite to the index finger was quite deep and opened this up.  I then connected drains to all areas and placed 3 L of saline through and through all areas.  A combination of vessel loop drains and Iodoform were used to pack the areas.  In total, there were 11 deep bites, which underwent  I and D.  I did culture these as there was early signs of an abscess and this would be termed an abscess I and D, about all 11 sites.  These 11 sites were 4 in the dorsal hand, 3 in the left index finger, and 4 in the volar forearm.  Following this, I performed I and D of 2 different large bites about the thumb, right hand.  I then packed these with Iodoform and performed a limited EPL tenolysis about the right thumb.  The patient tolerated this well.  Copious amounts of irrigant were placed.  The patient had no complicating features.  She was dressed sterilely and will be admitted. I have discussed all issues with the family.  We will plan to remove her drains sometime in 24-36 hours.  All questions have been encouraged and answered.     Dionne Ano. Amanda Pea, M.D.     [facility] ([place])  D:  09/23/2016  T:  09/24/2016  Job:  [id number]

## 2016-09-24 NOTE — Progress Notes (Signed)
Subjective: 1 Day Post-Op Procedure(s) (LRB): IRRIGATION AND DEBRIDEMENT BILATERAL ARMS (Bilateral) Patient reports pain as improved about the bilateral extremities. However, she is fairly nauseous and had 2 episodes of vomiting earlier this morning. She has previously been given Zofran with minimal relief.  Objective: Vital signs in last 24 hours: Temp:  [97 F (36.1 C)-99.2 F (37.3 C)] 98.3 F (36.8 C) (04/25 0430) Pulse Rate:  [66-100] 72 (04/25 0430) Resp:  [13-18] 16 (04/25 0430) BP: (105-137)/(60-98) 105/63 (04/25 0430) SpO2:  [79 %-100 %] 96 % (04/25 0430)  Intake/Output from previous day: 04/24 0701 - 04/25 0700 In: 345.5 [I.V.:95.5; IV Piggyback:250] Out: -  Intake/Output this shift: No intake/output data recorded.   Recent Labs  09/23/16 1628  HGB 14.9    Recent Labs  09/23/16 1628  WBC 11.3*  RBC 4.39  HCT 43.3  PLT 188    Recent Labs  09/23/16 1628  NA 137  K 4.1  CL 103  CO2 26  BUN 16  CREATININE 0.84  GLUCOSE 115*  CALCIUM 9.5   No results for input(s): LABPT, INR in the last 72 hours.  The patient is alert and oriented in no acute distress. The patient complains of pain in the affected upper extremity.  The patient is noted to have a normal HEENT exam. Lung fields show equal chest expansion and no shortness of breath. Abdomen exam is nontender without distention. Lower extremity examination does not show any fracture dislocation or blood clot symptoms. Pelvis is stable and the neck and back are stable and nontender. Examination of the bilateral upper extremities reveals that her dressings are clean dry and intact the exposed digits have no signs of cellulitic changes present. Her digital range of motion looks very well at this juncture.  Assessment/Plan: 1 Day Post-Op Procedure(s) (LRB): IRRIGATION AND DEBRIDEMENT BILATERAL ARMS (Bilateral) We have discussed with her continuing IV antibiotics, close observation and awaiting her culture  results. We have discussed with her that we will plan to do a wound check tomorrow and pull the drains. We have recommended attempts at full digital range of motion as she tolerates. We will switch to Phenergan for the nausea at this juncture. All questions were encouraged and answered.  Azure Budnick L 09/24/2016, 9:33 AM

## 2016-09-25 ENCOUNTER — Encounter (HOSPITAL_COMMUNITY): Payer: Self-pay | Admitting: General Practice

## 2016-09-25 MED ORDER — MORPHINE SULFATE (PF) 4 MG/ML IV SOLN: 2.0000 mg | INTRAVENOUS | Status: DC | PRN

## 2016-09-25 MED ORDER — SODIUM CHLORIDE 0.9 % IV SOLN
12.5000 mg | Freq: Two times a day (BID) | INTRAVENOUS | Status: DC | PRN
  Administered 2016-09-25: 12.5 mg via INTRAVENOUS
  Filled 2016-09-25: qty 0.5

## 2016-09-25 MED ORDER — ACETAMINOPHEN 325 MG PO TABS
650.0000 mg | ORAL_TABLET | ORAL | Status: DC | PRN
  Administered 2016-09-25 – 2016-09-26 (×3): 650 mg via ORAL
  Filled 2016-09-25 (×3): qty 2

## 2016-09-25 NOTE — Progress Notes (Signed)
Subjective: 2 Days Post-Op Procedure(s) (LRB): IRRIGATION AND DEBRIDEMENT BILATERAL ARMS (Bilateral) Patient reports pain as markedly improved. She still continues to have nausea. She did have Zofran administered earlier this morning without significant improvement. She last had Phenergan yesterday morning. She did tolerate a regular diet last night. She denies fevers or chills  Objective: Vital signs in last 24 hours: Temp:  [98 F (36.7 C)-99.3 F (37.4 C)] 98.4 F (36.9 C) (04/26 0752) Pulse Rate:  [64-76] 74 (04/26 0752) Resp:  [16-19] 16 (04/26 0752) BP: (110-121)/(60-64) 112/64 (04/26 0752) SpO2:  [98 %-100 %] 98 % (04/26 0752)  Intake/Output from previous day: 04/25 0701 - 04/26 0700 In: 2512 [P.O.:582; I.V.:1630; IV Piggyback:300] Out: -  Intake/Output this shift: No intake/output data recorded.   Recent Labs  09/23/16 1628  HGB 14.9    Recent Labs  09/23/16 1628  WBC 11.3*  RBC 4.39  HCT 43.3  PLT 188    Recent Labs  09/23/16 1628  NA 137  K 4.1  CL 103  CO2 26  BUN 16  CREATININE 0.84  GLUCOSE 115*  CALCIUM 9.5   No results for input(s): LABPT, INR in the last 72 hours.  The patient is alert and oriented in no acute distress. The patient complains of pain in the affected upper extremity.  The patient is noted to have a normal HEENT exam. Lung fields show equal chest expansion and no shortness of breath. Abdomen exam is nontender without distention. Lower extremity examination does not show any fracture dislocation or blood clot symptoms. Pelvis is stable and the neck and back are stable and nontender. Examination of the left upper extremity shows that her dressings are removed she has had significant improvement of the overall erythema the drains are removed multiple in nature. There are no signs of active purulence present she does have some mild amount of sero-proteinaceous debris present in the depths of the wounds. No signs of cellulitis  present.  Examination of the right thumb is that her swelling and erythema markedly improved the drains are removed there is no purulent drainage present there is no signs of cellulitis present.  Assessment/Plan: 2 Days Post-Op Procedure(s) (LRB): IRRIGATION AND DEBRIDEMENT BILATERAL ARMS (Bilateral) Overall, she has a significant amount improvement in regards to her wounds at this juncture. I have obtained verbal consent at bedside to perform rogation and debridement as well as packing of the wounds. Obtaining verbal consent each wound about the left forearm and hand was irrigated copiously with saline following this pickup and sharp scissors were utilized to debride the sero-proteinaceous debris and necrotic tissue E tolerated this well following this irrigation was once again implemented and wet-to-dry packing was performed to the wounds. Soft dressings were then applied. Tension was then turned to the right thumb where irrigation was implemented into the depths of the wound following this [place] of the depths of the wound was performed 2 about the right thumb. Wet-to-dry dressings followed by soft wrap was then applied. I have discussed with her today trying to curtail her nausea, Phenergan 12.5 mg IV will be implemented now and once the nausea is under control we can then resume the Zofran. She does not have to take pain medications at this juncture and Tylenol will suffice. Ulcers are still currently pending but given the marked amount of improvement of her overall wounds will certainly consider discharge tomorrow. We will have hydrotherapy see her tomorrow full her wet wicking's perform hydrotherapy and then teach her how  to perform wet-to-dry dressing changes for over the weekend. Active and passive range of motion to the digits can be employed at this juncture.  Anvith Mauriello L 09/25/2016, 10:52 AM

## 2016-09-26 MED ORDER — CIPROFLOXACIN HCL 500 MG PO TABS: 500.0000 mg | ORAL_TABLET | Freq: Two times a day (BID) | ORAL | 0 refills | Status: DC

## 2016-09-26 MED ORDER — OXYCODONE HCL 5 MG PO TABS: 5.0000 mg | ORAL_TABLET | ORAL | 0 refills | Status: DC | PRN

## 2016-09-26 MED ORDER — CLINDAMYCIN HCL 300 MG PO CAPS: 300.0000 mg | ORAL_CAPSULE | Freq: Three times a day (TID) | ORAL | 0 refills | Status: DC

## 2016-09-26 NOTE — Progress Notes (Signed)
Patient ID: [name], female   DOB: 1950-08-31, 66 y.o.   MRN: [id number] Much better.  Procedure I removed all the wick drains following this a performed irrigation and debridement of skin and subcutaneous tissue with knife scissor and curette. The wounds were then packed. This is an irrigation and debridement less than 20 cm of skin is obtaining his tissue. Overall the tissue looks excellent. We will have to watch the left middle finger MCP region but there is no evidence of advanced infection presenting itself.  The patient be DC'd today.  Please see discharge summary.  She'll see me tomorrow at 9 AM we'll continue Cipro and clindamycin.  Darsh Vandevoort M.D.

## 2016-09-26 NOTE — Discharge Instructions (Signed)

## 2016-09-26 NOTE — Discharge Summary (Signed)
Physician Discharge Summary  Patient ID: [name] MRN: [id number] DOB/AGE: July 28, 1950 66 y.o.  Admit date: 09/23/2016 Discharge date:   Admission Diagnoses: CAT BITES BILATERAL ARMS Past Medical History:  Diagnosis Date  . Attention deficit disorder (ADD)   . 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 Depression   . GERD (gastroesophageal reflux disease)   . High cholesterol   . History of anemia   . Hypothyroidism   . Osteoarthritis     Discharge Diagnoses:  Active Problems:   Cat bite   Surgeries: Procedure(s): IRRIGATION AND DEBRIDEMENT BILATERAL ARMS on 09/23/2016    Consultants:   Discharged Condition: Improved  Hospital Course: [name] is an 66 y.o. female who was admitted 09/23/2016 with a chief complaint of Chief Complaint  Patient presents with  . Animal Bite  , and found to have a diagnosis of CAT BITES BILATERAL ARMS.  They were brought to the operating room on 09/23/2016 and underwent Procedure(s): IRRIGATION AND DEBRIDEMENT BILATERAL ARMS.    They were given perioperative antibiotics: Anti-infectives    Start     Dose/Rate Route Frequency Ordered Stop   09/26/16 0000  ciprofloxacin (CIPRO) 500 MG tablet     500 mg Oral 2 times daily 09/26/16 1055     09/26/16 0000  clindamycin (CLEOCIN) 300 MG capsule     300 mg Oral 3 times daily 09/26/16 1055     09/24/16 0300  clindamycin (CLEOCIN) IVPB 600 mg     600 mg 100 mL/hr over 30 Minutes Intravenous Every 8 hours 09/23/16 2223     09/23/16 2230  ciprofloxacin (CIPRO) IVPB 400 mg     400 mg 200 mL/hr over 60 Minutes Intravenous Every 12 hours 09/23/16 2223      .  They were given sequential compression devices, early ambulation, and Other (comment) for DVT prophylaxis.  Recent vital signs: Patient Vitals for the past 24 hrs:  BP Temp Temp src Pulse Resp SpO2  09/26/16 0427 130/69 98.1 F (36.7 C) Oral 82 20 99 %  09/25/16 2039 127/65 99.5 F (37.5 C) Oral 80 20 99 %  09/25/16  1422 118/66 99.3 F (37.4 C) Oral 79 18 100 %  .  Recent laboratory studies: No results found.  Discharge Medications:   Allergies as of 09/26/2016      Reactions   Penicillins Other (See Comments)   SYNCOPE Childhood Allergy      Medication List    TAKE these medications   ALPRAZolam 0.25 MG tablet Commonly known as:  XANAX Take 0.25 tablets by mouth as needed.   atorvastatin 40 MG tablet Commonly known as:  LIPITOR Take 40 mg by mouth daily.   butalbital-aspirin-caffeine 50-325-40 MG capsule Commonly known as:  FIORINAL Take 50 capsules by mouth as needed.   calcium citrate-vitamin D 200-200 MG-UNIT Tabs Take 1 tablet by mouth daily.   cetirizine 10 MG tablet Commonly known as:  ZYRTEC Take 10 mg by mouth daily.   ciprofloxacin 500 MG tablet Commonly known as:  CIPRO Take 1 tablet (500 mg total) by mouth 2 (two) times daily.   clindamycin 300 MG capsule Commonly known as:  CLEOCIN Take 1 capsule (300 mg total) by mouth 3 (three) times daily.   DEXILANT 60 MG capsule Generic drug:  dexlansoprazole Take 60 mg by mouth daily.   escitalopram 10 MG tablet Commonly known as:  LEXAPRO Take 10 mg by mouth daily.   ferrous sulfate 325 (65 FE) MG tablet  Take 325 mg by mouth daily with breakfast.   Levothyroxine Sodium 100 MCG Caps Take 100 mcg by mouth daily.   oxyCODONE 5 MG immediate release tablet Commonly known as:  Oxy IR/ROXICODONE Take 1-2 tablets (5-10 mg total) by mouth every 4 (four) hours as needed for moderate pain.   vitamin E 400 UNIT capsule Take 400 Units by mouth daily.       Diagnostic Studies: Dg Forearm Left  Result Date: 09/23/2016 CLINICAL DATA:  Pain following cat bite and scratch EXAM: LEFT FOREARM - 2 VIEW COMPARISON:  None. FINDINGS: Frontal and lateral views were obtained. No fracture or dislocation. No radiopaque foreign body or soft tissue air. There is mild osteoarthritic change in the radiocarpal joint. No elbow joint  effusion. The pronator quadratus fat pad is not elevated. Subchondral cysts are noted in the proximal lunate bone. IMPRESSION: No bony destruction or erosion. No fracture or dislocation. No soft tissue air or radiopaque foreign body. Osteoarthritic change noted in the radiocarpal joint. Electronically Signed   By: Bretta Bang III M.D.   On: 09/23/2016 16:14   Dg Hand Complete Left  Result Date: 09/23/2016 CLINICAL DATA:  Cat bite EXAM: LEFT HAND - COMPLETE 3+ VIEW COMPARISON:  None. FINDINGS: Frontal, oblique, and lateral views were obtained. There is soft tissue swelling dorsally over the distal metacarpals. There is also soft tissue swelling of the second digit. There is no soft tissue air or radiopaque foreign body. There is no evident fracture or dislocation. No erosive change or bony destruction. There is moderate osteoarthritic change in the scaphotrapezial joint. There is also osteoarthritic change in the first carpal -metacarpal joint. IMPRESSION: Soft tissue swelling involving the second digit as well as the dorsal aspect of the mid hand at the distal metacarpal levels. No radiopaque foreign body or soft tissue air. No fracture or dislocation. No erosive change or bony destruction. Osteoarthritic change, most notably in the scaphotrapezial joint. Electronically Signed   By: Bretta Bang III M.D.   On: 09/23/2016 16:13   Dg Finger Thumb Right  Result Date: 09/23/2016 CLINICAL DATA:  Cat bite and scratch EXAM: RIGHT THUMB 2+V COMPARISON:  None. FINDINGS: Frontal, oblique, and lateral views were obtained. There is no demonstrable soft tissue air or radiopaque foreign body. No fracture or dislocation. There is osteoarthritic change in the first carpal -metacarpal joint. Other joint spaces appear unremarkable. No erosive change or bony destruction. IMPRESSION: Osteoarthritic change in the first carpal -metacarpal joint. No erosive change or bony destruction. No fracture or dislocation. No  radiopaque foreign body or soft tissue air. Electronically Signed   By: Bretta Bang III M.D.   On: 09/23/2016 16:16    They benefited maximally from their hospital stay and there were no complications.     Disposition: 01-Home or Self Care Discharge Instructions    Call MD / Call 911    Complete by:  As directed    If you experience chest pain or shortness of breath, CALL 911 and be transported to the hospital emergency room.  If you develope a fever above 101 F, pus (white drainage) or increased drainage or redness at the wound, or calf pain, call your surgeon's office.   Constipation Prevention    Complete by:  As directed    Drink plenty of fluids.  Prune juice may be helpful.  You may use a stool softener, such as Colace (over the counter) 100 mg twice a day.  Use MiraLax (over the counter) for  constipation as needed.   Diet - low sodium heart healthy    Complete by:  As directed    Increase activity slowly as tolerated    Complete by:  As directed      Follow-up Information    Karen Chafe, MD Follow up in 1 day(s).   Specialty:  Orthopedic Surgery Why:  9am 4/28 Contact information: [address] [place] [address] [phone]            Signed: Karen Chafe 09/26/2016, 10:55 AM

## 2016-09-28 LAB — AEROBIC/ANAEROBIC CULTURE W GRAM STAIN (SURGICAL/DEEP WOUND)

## 2016-09-29 NOTE — H&P (Signed)
NAME:  [name], [name] NO.:  MEDICAL RECORD NO.:  [id number]  LOCATION:                                 FACILITY:  PHYSICIAN:  Dionne Ano. Amanda Pea, M.D.     DATE OF BIRTH:  DATE OF ADMISSION: DATE OF DISCHARGE:                             HISTORY & PHYSICAL   HISTORY OF PRESENT ILLNESS:  Ms. [name] presents with multiple cat bites.  The patient has her right thumb and left arm heavily burdened by cat bite issues.  She was seen at Urgent Care and another physician today and was referred to the ER due to the worsening.  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  She is painful.  She denies lower extremity pain, nausea, or vomiting.  On examination of her past medical and surgical history, I should note that she is allergic to penicillin.  Her past medical and surgical history are reviewed in her chart.  The patient has hypercholesterolemia, occasional tachycardia, metatarsalgia, and bunion deformity about her feet.  PHYSICAL EXAMINATION:  GENERAL:  Her examination shows a pleasant female, alert and oriented, in no acute distress. VITAL SIGNS:  Stable. MUSCULOSKELETAL:  She has full sensation to the elbow, wrist, and forearm.  About the right side, she has bites about her thumb which were deep in location and early infection is present.  Her left arm is burdened by multiple forearm bites as well as left middle finger dorsal MCP bites x4 and index finger bites x3.  She has difficulty moving the finger, she is highly tender.  I have reviewed this issue with her at length and the findings at present time.  She is reviewed in great detail.  She has no evidence of obvious bony destruction about the AP and lateral x-rays of the left forearm and right thumb; however, she does have some osteoarthritic change.  There was no evidence of advanced soft tissue air.  I spent  greater than 60 minutes with her at bedside.  I feel that she is going to require an I and D.  We are going to plan to proceed with surgery.  IMPRESSION:  Multiple cat bites with early infectious sequelae.  PLAN:  We have booked her for surgery.  We will plan for surgical I and D of multiple cat bites and admit her for IV antibiotics, general postop observation, and other measures.  I have discussed the risks and benefits, do's and don'ts.  We are going to move forward accordingly with the surgical algorithm of care.  It was a pleasure to see her today.  We will look forward to try and to get her back into a quiescent state of affairs as soon as possible.     Dionne Ano. Amanda Pea, M.D.     [facility]  D:  09/27/2016  T:  09/28/2016  Job:  [id number]

## 2016-11-03 NOTE — Addendum Note (Signed)
Addendum  created 11/03/16 1420 by Val EagleMoser, Tanise Russman, MD   Sign clinical note

## 2017-01-01 ENCOUNTER — Other Ambulatory Visit: Payer: Self-pay | Admitting: Internal Medicine

## 2017-01-01 DIAGNOSIS — R140 Abdominal distension (gaseous): Secondary | ICD-10-CM

## 2017-01-02 ENCOUNTER — Ambulatory Visit
Admission: RE | Admit: 2017-01-02 | Discharge: 2017-01-02 | Disposition: A | Discharge status: Home / Self Care | Payer: BLUE CROSS/BLUE SHIELD | Source: Ambulatory Visit | Attending: Internal Medicine | Admitting: Internal Medicine

## 2017-01-02 DIAGNOSIS — R140 Abdominal distension (gaseous): Secondary | ICD-10-CM

## 2017-06-01 ENCOUNTER — Encounter (HOSPITAL_COMMUNITY): Payer: Self-pay

## 2017-06-01 ENCOUNTER — Other Ambulatory Visit (HOSPITAL_COMMUNITY): Payer: Self-pay | Admitting: Family Medicine

## 2017-06-01 ENCOUNTER — Ambulatory Visit (HOSPITAL_COMMUNITY)
Admission: RE | Admit: 2017-06-01 | Discharge: 2017-06-01 | Disposition: A | Discharge status: Home / Self Care | Payer: BLUE CROSS/BLUE SHIELD | Source: Ambulatory Visit | Attending: Vascular Surgery | Admitting: Vascular Surgery

## 2017-06-01 ENCOUNTER — Ambulatory Visit (HOSPITAL_COMMUNITY): Admission: RE | Admit: 2017-06-01 | Payer: BLUE CROSS/BLUE SHIELD | Source: Ambulatory Visit

## 2017-06-01 ENCOUNTER — Ambulatory Visit (INDEPENDENT_AMBULATORY_CARE_PROVIDER_SITE_OTHER)
Admission: RE | Admit: 2017-06-01 | Discharge: 2017-06-01 | Disposition: A | Discharge status: Home / Self Care | Payer: BLUE CROSS/BLUE SHIELD | Source: Ambulatory Visit | Attending: Vascular Surgery | Admitting: Vascular Surgery

## 2017-06-01 DIAGNOSIS — M79604 Pain in right leg: Secondary | ICD-10-CM | POA: Insufficient documentation

## 2017-06-01 DIAGNOSIS — M79605 Pain in left leg: Secondary | ICD-10-CM | POA: Diagnosis present

## 2017-06-30 NOTE — Progress Notes (Addendum)
Office Visit Note  Patient: [name]             Date of Birth: 1950/11/14           MRN: [id number]             PCP: Rodrigo Ran, MD Referring: Rodrigo Ran, MD Visit Date: 07/03/2017 Occupation: Network engineer    Subjective:  Other (BIL hand pain)   History of Present Illness: [name] is a 67 y.o. female with history of osteoarthritis.  Patient states that she has seen me several years ago for evaluation of osteoarthritis.  She states in her 30s she started having pain and discomfort in her bilateral hands and bilateral feet.  She has also  noticed changes in the shape of her joints.  She has noticed bunions on her bilateral feet and  hammertoes.  She had been under care of Dr. Despina Hick for right knee joint discomfort.  She states he diagnosed her with end-stage osteoarthritis.  She just finished physical therapy and the plan is to proceed with Visco supplement injections.  We discussed future total knee replacement.  She is also had discomfort in her right trochanteric area.  She was exercising on a regular basis but quit exercising 1 year ago due to right knee joint discomfort.  She denies any joint swelling.  Activities of Daily Living:  Patient reports morning stiffness for 2 minutes.   Patient Reports nocturnal pain.  Difficulty dressing/grooming: Denies Difficulty climbing stairs: Reports Difficulty getting out of chair: Reports Difficulty using hands for taps, buttons, cutlery, and/or writing: Reports   Review of Systems  Constitutional: Positive for fatigue. Negative for night sweats, weight gain, weight loss and weakness.  HENT: Positive for mouth dryness. Negative for mouth sores, trouble swallowing, trouble swallowing and nose dryness.   Eyes: Negative for pain, redness, visual disturbance and dryness.  Respiratory: Negative for cough, shortness of breath and difficulty breathing.   Cardiovascular: Negative for chest pain, palpitations,  hypertension, irregular heartbeat and swelling in legs/feet.  Gastrointestinal: Negative for blood in stool, constipation and diarrhea.  Endocrine: Negative for increased urination.  Genitourinary: Negative for vaginal dryness.  Musculoskeletal: Positive for arthralgias, joint pain and morning stiffness. Negative for joint swelling, myalgias, muscle weakness, muscle tenderness and myalgias.  Skin: Positive for hair loss. Negative for color change, rash, skin tightness, ulcers and sensitivity to sunlight.  Allergic/Immunologic: Negative for susceptible to infections.  Neurological: Negative for dizziness, memory loss and night sweats.  Hematological: Negative for swollen glands.  Psychiatric/Behavioral: Positive for sleep disturbance. Negative for depressed mood. The patient is nervous/anxious.     PMFS History:  Patient Active Problem List   Diagnosis Date Noted  . Acquired hallux valgus of both feet 07/03/2017  . History of carpal tunnel release of both wrists 07/03/2017  . History of hypothyroidism 07/03/2017  . History of anxiety 07/03/2017  . History of gastroesophageal reflux (GERD) 07/03/2017  . Osteopenia of multiple sites 07/03/2017  . Cat bite 09/23/2016  . Metatarsalgia of both feet 08/21/2014  . Bunion 08/21/2014  . Hammer toe 08/21/2014  . HYPERCHOLESTEROLEMIA 04/18/2010  . UNSPECIFIED TACHYCARDIA 04/18/2010    Past Medical History:  Diagnosis Date  . Attention deficit disorder (ADD)   . 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 Depression   . GERD (gastroesophageal reflux disease)   . High cholesterol   . History of anemia   . Hypothyroidism   . Osteoarthritis     Family  History  Problem Relation Age of Onset  . Alzheimer's disease Father   . Alcoholism Mother    Past Surgical History:  Procedure Laterality Date  . CARPAL TUNNEL RELEASE  02/03/2012   Procedure: CARPAL TUNNEL RELEASE;  Surgeon: Wyn Forsterobert V Sypher Jr., MD;  Location: [facility];  Service: Orthopedics;  Laterality: Left;  . CARPAL TUNNEL RELEASE  04/20/2012   Procedure: CARPAL TUNNEL RELEASE;  Surgeon: Wyn Forsterobert V Sypher Jr., MD;  Location: [facility];  Service: Orthopedics;  Laterality: Right;  RIGHT CARPAL TUNNEL RELEASE   . COMBINED HYSTEROSCOPY DIAGNOSTIC / D&C  06/29/2001   with resectoscopic polypectomy  . I&D EXTREMITY Bilateral 09/23/2016   Procedure: IRRIGATION AND DEBRIDEMENT BILATERAL ARMS;  Surgeon: Dominica SeverinWilliam Gramig, MD;  Location: [facility];  Service: Orthopedics;  Laterality: Bilateral;  . TRIGGER FINGER RELEASE  04/20/2012   Procedure: RELEASE TRIGGER FINGER/A-1 PULLEY;  Surgeon: Wyn Forsterobert V Sypher Jr., MD;  Location: [facility];  Service: Orthopedics;  Laterality: Right;  RIGHT LONG A-1 RELEASE, RIGHT RING A-1 PULLEY RELEASE    Social History   Social History Narrative   Lives at home with husband and daughter.       Objective: Vital Signs: BP 134/81 (BP Location: Left Arm, Patient Position: Sitting, Cuff Size: Normal)   Pulse 76   Resp 16   Ht 5' 2.5" (1.588 m)   Wt 123 lb (55.8 kg)   BMI 22.14 kg/m    Physical Exam  Constitutional: She is oriented to person, place, and time. She appears well-developed and well-nourished.  HENT:  Head: Normocephalic and atraumatic.  Eyes: Conjunctivae and EOM are normal.  Neck: Normal range of motion.  Cardiovascular: Normal rate, regular rhythm, normal heart sounds and intact distal pulses.  Pulmonary/Chest: Effort normal and breath sounds normal.  Abdominal: Soft. Bowel sounds are normal.  Lymphadenopathy:    She has no cervical adenopathy.  Neurological: She is alert and oriented to person, place, and time.  Skin: Skin is warm and dry. Capillary refill takes less than 2 seconds.  Psychiatric: She has a normal mood and affect. Her behavior is normal.  Nursing note and vitals reviewed.    Musculoskeletal Exam: C-spine good range of motion.  She has some discomfort with range  of motion of the lumbar spine.  She has good range of motion of her shoulders elbows wrist.  She has thickening of her right second and third MCP joint.  She also has PIP/DIP thickening in bilateral hands.  Her hip joints are good range of motion.  She has Baker's cyst in her bilateral knee joints and thickening of her right knee joint.  She has bilateral hallux valgus deformity and the right third hammertoe.  She has Dupuytren's contracture in bilateral fourth finger  CDAI Exam: No CDAI exam completed.    Investigation: No additional findings. CBC Latest Ref Rng & Units 09/23/2016 04/20/2012 02/03/2012  WBC 4.0 - 10.5 K/uL 11.3(H) - -  Hemoglobin 12.0 - 15.0 g/dL 78.414.9 69.613.5 29.514.1  Hematocrit 36.0 - 46.0 % 43.3 - -  Platelets 150 - 400 K/uL 188 - -   CMP Latest Ref Rng & Units 09/23/2016 08/21/2010 05/29/2009  Glucose 65 - 99 mg/dL 284(X115(H) - 92  BUN 6 - 20 mg/dL 16 - 17  Creatinine 3.240.44 - 1.00 mg/dL 4.010.84 - 0.270.79  Sodium 253135 - 145 mmol/L 137 - 142  Potassium 3.5 - 5.1 mmol/L 4.1 - 4.3  Chloride 101 - 111 mmol/L 103 -  103  CO2 22 - 32 mmol/L 26 - 27  Calcium 8.9 - 10.3 mg/dL 9.5 - 16.1  Total Protein 6.0 - 8.3 g/dL - 6.4 7.0  Total Bilirubin 0.3 - 1.2 mg/dL - 0.4 0.3  Alkaline Phos 39 - 117 U/L - 56 82  AST 0 - 37 U/L - 25 23  ALT 0 - 35 U/L - 22 21    Imaging: Xr Hip Unilat W Or W/o Pelvis 2-3 Views Right  Result Date: 07/03/2017 No hip joint narrowing was noted.  No SI joint changes were noted. Impression:unremarkable x-ray of the hip joint.  Xr Foot 2 Views Left  Result Date: 07/03/2017 First MTP narrowing and subluxation with cystic changes was noted.  Second MTP narrowing was noted.  All PIP and DIP narrowing was noted.  Dorsal spurring was noted.  No erosive changes were noted. Impression: These findings are consistent with osteoarthritis of the foot.  Although the narrowing of the second MTP raises concern of inflammatory arthritis.  Xr Foot 2 Views Right  Result Date:  07/03/2017 First MTP narrowing and subluxation was noted.  Severe narrowing of the second MTP joint was noted.  Without any erosive changes.  PIP and DIP narrowing without any erosive changes were noted. Impression: These findings are consistent with osteoarthritis.  The second MTP narrowing raises the concern of inflammatory arthritis.  Xr Hand 2 View Left  Result Date: 07/03/2017 Minimal PIP DIP narrowing was noted.  No MCP, intercarpal or radiocarpal joint space narrowing was noted.  Mild CMC narrowing was noted. Impression: These findings are consistent with osteoarthritis of the hand.  Xr Hand 2 View Right  Result Date: 07/03/2017 PIP DIP narrowing was noted.  Subluxation of the fourth DIP joint was noted.  First MCP narrowing was noted. CMC narrowing was noted.  No intercarpal radiocarpal joint space narrowing was noted. Impression: These findings are consistent with osteoarthritis of the hand.  Xr Lumbar Spine 2-3 Views  Result Date: 07/03/2017 Mild dextroscoliosis was noted.  Anterior spurring was noted.  L1-2, L2-3, L3-4 narrowing was noted.  Facet joint arthropathy was noted. Impression: These findings are consistent with spinal spondylosis and facet joint arthropathy.   Speciality Comments: No specialty comments available.    Procedures:  No procedures performed Allergies: Penicillins and Alpha-gal   Assessment / Plan:     Visit Diagnoses: Pain in both hands - Plan: XR Hand 2 View Right, XR Hand 2 View Left.  The clinical and radiographic findings are consistent with osteoarthritis of the hands.  Joint protection muscle strengthening was discussed.  A list of natural anti-inflammatories was given.  The hand muscle strengthening exercise were demonstrated in the office today.  Dupuytren's contracture right fourth flexor tendon.  Pain in right hip - Plan: XR HIP UNILAT W OR W/O PELVIS 2-3 VIEWS RIGHT.  The x-ray of the right hip was unremarkable.  She has some symptoms of right  trochanteric bursitis.  IT band exercises were demonstrated in the office.  Metatarsalgia of both feet - Plan: XR Foot 2 Views Right, XR Foot 2 Views Left.  The x-rays reveal osteoarthritic changes in the feet.  Although she has bilateral second MTP narrowing that raises the concern of inflammatory arthritis.  She does not have any discomfort.  I will schedule ultrasound to rule out synovitis.  Acquired hallux valgus of both feet  Chronic midline low back pain without sciatica - Plan: XR Lumbar Spine 2-3 Views.  She had mild scoliosis and multilevel spondylosis  and facet joint arthropathy.  Muscle strengthening exercises were discussed.  History of carpal tunnel release of both wrists  Hypercholesterolemia  History of hypothyroidism  History of anxiety  History of gastroesophageal reflux (GERD)  Osteopenia of multiple sites: Patient states she was treated with Fosamax in the past and her bone density improved from osteoporosis to osteopenia.  Use of calcium vitamin D and resistive exercises was discussed.   Orders: Orders Placed This Encounter  Procedures  . XR Hand 2 View Right  . XR Hand 2 View Left  . XR HIP UNILAT W OR W/O PELVIS 2-3 VIEWS RIGHT  . XR Lumbar Spine 2-3 Views  . XR Foot 2 Views Right  . XR Foot 2 Views Left   No orders of the defined types were placed in this encounter.   Face-to-face time spent with patient was 60 minutes.  Greater than 50% of time was spent in counseling and coordination of care.  Follow-Up Instructions: Return for Osteoarthritis.   Pollyann Savoy, MD  Note - This record has been created using Animal nutritionist.  Chart creation errors have been sought, but may not always  have been located. Such creation errors do not reflect on  the standard of medical care.

## 2017-07-03 ENCOUNTER — Ambulatory Visit (INDEPENDENT_AMBULATORY_CARE_PROVIDER_SITE_OTHER): Payer: BLUE CROSS/BLUE SHIELD | Admitting: Rheumatology

## 2017-07-03 ENCOUNTER — Ambulatory Visit (INDEPENDENT_AMBULATORY_CARE_PROVIDER_SITE_OTHER): Payer: Self-pay

## 2017-07-03 ENCOUNTER — Encounter: Payer: Self-pay | Admitting: Rheumatology

## 2017-07-03 VITALS — BP 134/81 | HR 76 | Resp 16 | Ht 62.5 in | Wt 123.0 lb

## 2017-07-03 DIAGNOSIS — M7741 Metatarsalgia, right foot: Secondary | ICD-10-CM | POA: Diagnosis not present

## 2017-07-03 DIAGNOSIS — Z8719 Personal history of other diseases of the digestive system: Secondary | ICD-10-CM | POA: Diagnosis not present

## 2017-07-03 DIAGNOSIS — M545 Low back pain, unspecified: Secondary | ICD-10-CM

## 2017-07-03 DIAGNOSIS — M8589 Other specified disorders of bone density and structure, multiple sites: Secondary | ICD-10-CM | POA: Diagnosis not present

## 2017-07-03 DIAGNOSIS — Z9889 Other specified postprocedural states: Secondary | ICD-10-CM | POA: Insufficient documentation

## 2017-07-03 DIAGNOSIS — M79642 Pain in left hand: Secondary | ICD-10-CM

## 2017-07-03 DIAGNOSIS — M7742 Metatarsalgia, left foot: Secondary | ICD-10-CM

## 2017-07-03 DIAGNOSIS — M2011 Hallux valgus (acquired), right foot: Secondary | ICD-10-CM | POA: Diagnosis not present

## 2017-07-03 DIAGNOSIS — M25551 Pain in right hip: Secondary | ICD-10-CM

## 2017-07-03 DIAGNOSIS — M79641 Pain in right hand: Secondary | ICD-10-CM | POA: Diagnosis not present

## 2017-07-03 DIAGNOSIS — G8929 Other chronic pain: Secondary | ICD-10-CM

## 2017-07-03 DIAGNOSIS — Z8639 Personal history of other endocrine, nutritional and metabolic disease: Secondary | ICD-10-CM | POA: Insufficient documentation

## 2017-07-03 DIAGNOSIS — E7800 Pure hypercholesterolemia, unspecified: Secondary | ICD-10-CM

## 2017-07-03 DIAGNOSIS — Z8659 Personal history of other mental and behavioral disorders: Secondary | ICD-10-CM | POA: Diagnosis not present

## 2017-07-03 DIAGNOSIS — M2012 Hallux valgus (acquired), left foot: Secondary | ICD-10-CM

## 2017-07-03 NOTE — Patient Instructions (Addendum)
 Natural anti-inflammatories  You can purchase these at [employer], [employer] or online.  . Turmeric (capsules)  . Ginger (ginger root or capsules)  . Omega 3 (Fish, flax seeds, chia seeds, walnuts, almonds)  . Tart cherry (dried or extract)   Patient should be under the care of a physician while taking these supplements. This may not be reproduced without the permission of Dr. Quran Vasco.   Hand Exercises Hand exercises can be helpful to almost anyone. These exercises can strengthen the hands, improve flexibility and movement, and increase blood flow to the hands. These results can make work and daily tasks easier. Hand exercises can be especially helpful for people who have joint pain from arthritis or have nerve damage from overuse (carpal tunnel syndrome). These exercises can also help people who have injured a hand. Most of these hand exercises are fairly gentle stretching routines. You can do them often throughout the day. Still, it is a good idea to ask your health care provider which exercises would be best for you. Warming your hands before exercise may help to reduce stiffness. You can do this with gentle massage or by placing your hands in warm water for 15 minutes. Also, make sure you pay attention to your level of hand pain as you begin an exercise routine. Exercises Knuckle Bend Repeat this exercise 5-10 times with each hand. 1. Stand or sit with your arm, hand, and all five fingers pointed straight up. Make sure your wrist is straight. 2. Gently and slowly bend your fingers down and inward until the tips of your fingers are touching the tops of your palm. 3. Hold this position for a few seconds. 4. Extend your fingers out to their original position, all pointing straight up again.  Finger Fan Repeat this exercise 5-10 times with each hand. 1. Hold your arm and hand out in front of you. Keep your wrist straight. 2. Squeeze your hand into a fist. 3. Hold this  position for a few seconds. 4. Fan out, or spread apart, your hand and fingers as much as possible, stretching every joint fully.  Tabletop Repeat this exercise 5-10 times with each hand. 1. Stand or sit with your arm, hand, and all five fingers pointed straight up. Make sure your wrist is straight. 2. Gently and slowly bend your fingers at the knuckles where they meet the hand until your hand is making an upside-down L shape. Your fingers should form a tabletop. 3. Hold this position for a few seconds. 4. Extend your fingers out to their original position, all pointing straight up again.  Making Os Repeat this exercise 5-10 times with each hand. 1. Stand or sit with your arm, hand, and all five fingers pointed straight up. Make sure your wrist is straight. 2. Make an O shape by touching your pointer finger to your thumb. Hold for a few seconds. Then open your hand wide. 3. Repeat this motion with each finger on your hand.  Table Spread Repeat this exercise 5-10 times with each hand. 1. Place your hand on a table with your palm facing down. Make sure your wrist is straight. 2. Spread your fingers out as much as possible. Hold this position for a few seconds. 3. Slide your fingers back together again. Hold for a few seconds.  Ball Grip  Repeat this exercise 10-15 times with each hand. 1. Hold a tennis ball or another soft ball in your hand. 2. While slowly increasing pressure, squeeze the ball as   hard as possible. 3. Squeeze as hard as you can for 3-5 seconds. 4. Relax and repeat.  Wrist Curls Repeat this exercise 10-15 times with each hand. 1. Sit in a chair that has armrests. 2. Hold a light weight in your hand, such as a dumbbell that weighs 1-3 pounds (0.5-1.4 kg). Ask your health care provider what weight would be best for you. 3. Rest your hand just over the end of the chair arm with your palm facing up. 4. Gently pivot your wrist up and down while holding the weight. Do not  twist your wrist from side to side.  Contact a health care provider if:  Your hand pain or discomfort gets much worse when you do an exercise.  Your hand pain or discomfort does not improve within 2 hours after you exercise. If you have any of these problems, stop doing these exercises right away. Do not do them again unless your health care provider says that you can. Get help right away if:  You develop sudden, severe hand pain. If this happens, stop doing these exercises right away. Do not do them again unless your health care provider says that you can. This information is not intended to replace advice given to you by your health care provider. Make sure you discuss any questions you have with your health care provider. Document Released: 04/30/2015 Document Revised: 10/25/2015 Document Reviewed: 11/27/2014 Elsevier Interactive Patient Education  2018 [employer] Inc[employer]  

## 2017-07-17 NOTE — Progress Notes (Deleted)
Office Visit Note  Patient: [name]             Date of Birth: 18-Aug-1950           MRN: [id number]             PCP: Rodrigo Ran, MD Referring: Rodrigo Ran, MD Visit Date: 07/31/2017 Occupation: @GUAROCC @    Subjective:  No chief complaint on file.   History of Present Illness: [name] is a 67 y.o. female ***   Activities of Daily Living:  Patient reports morning stiffness for *** {minute/hour:19697}.   Patient {ACTIONS;DENIES/REPORTS:21021675::"Denies"} nocturnal pain.  Difficulty dressing/grooming: {ACTIONS;DENIES/REPORTS:21021675::"Denies"} Difficulty climbing stairs: {ACTIONS;DENIES/REPORTS:21021675::"Denies"} Difficulty getting out of chair: {ACTIONS;DENIES/REPORTS:21021675::"Denies"} Difficulty using hands for taps, buttons, cutlery, and/or writing: {ACTIONS;DENIES/REPORTS:21021675::"Denies"}   No Rheumatology ROS completed.   PMFS History:  Patient Active Problem List   Diagnosis Date Noted  . Acquired hallux valgus of both feet 07/03/2017  . History of carpal tunnel release of both wrists 07/03/2017  . History of hypothyroidism 07/03/2017  . History of anxiety 07/03/2017  . History of gastroesophageal reflux (GERD) 07/03/2017  . Osteopenia of multiple sites 07/03/2017  . Cat bite 09/23/2016  . Metatarsalgia of both feet 08/21/2014  . Bunion 08/21/2014  . Hammer toe 08/21/2014  . HYPERCHOLESTEROLEMIA 04/18/2010  . UNSPECIFIED TACHYCARDIA 04/18/2010    Past Medical History:  Diagnosis Date  . Attention deficit disorder (ADD)   . 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 Depression   . GERD (gastroesophageal reflux disease)   . High cholesterol   . History of anemia   . Hypothyroidism   . Osteoarthritis     Family History  Problem Relation Age of Onset  . Alzheimer's disease Father   . Alcoholism Mother    Past Surgical History:  Procedure Laterality Date  . CARPAL TUNNEL RELEASE  02/03/2012   Procedure: CARPAL TUNNEL  RELEASE;  Surgeon: Wyn Forster., MD;  Location: [facility];  Service: Orthopedics;  Laterality: Left;  . CARPAL TUNNEL RELEASE  04/20/2012   Procedure: CARPAL TUNNEL RELEASE;  Surgeon: Wyn Forster., MD;  Location: [facility];  Service: Orthopedics;  Laterality: Right;  RIGHT CARPAL TUNNEL RELEASE   . COMBINED HYSTEROSCOPY DIAGNOSTIC / D&C  06/29/2001   with resectoscopic polypectomy  . I&D EXTREMITY Bilateral 09/23/2016   Procedure: IRRIGATION AND DEBRIDEMENT BILATERAL ARMS;  Surgeon: Dominica Severin, MD;  Location: [facility];  Service: Orthopedics;  Laterality: Bilateral;  . TRIGGER FINGER RELEASE  04/20/2012   Procedure: RELEASE TRIGGER FINGER/A-1 PULLEY;  Surgeon: Wyn Forster., MD;  Location: [facility];  Service: Orthopedics;  Laterality: Right;  RIGHT LONG A-1 RELEASE, RIGHT RING A-1 PULLEY RELEASE    Social History   Social History Narrative   Lives at home with husband and daughter.       Objective: Vital Signs: There were no vitals taken for this visit.   Physical Exam   Musculoskeletal Exam: ***  CDAI Exam: No CDAI exam completed.    Investigation: No additional findings. CBC Latest Ref Rng & Units 09/23/2016 04/20/2012 02/03/2012  WBC 4.0 - 10.5 K/uL 11.3(H) - -  Hemoglobin 12.0 - 15.0 g/dL 78.2 95.6 21.3  Hematocrit 36.0 - 46.0 % 43.3 - -  Platelets 150 - 400 K/uL 188 - -   CMP Latest Ref Rng & Units 09/23/2016 08/21/2010 05/29/2009  Glucose 65 - 99 mg/dL 086(V) - 92  BUN 6 - 20  mg/dL 16 - 17  Creatinine 1.610.44 - 1.00 mg/dL 0.960.84 - 0.450.79  Sodium 409135 - 145 mmol/L 137 - 142  Potassium 3.5 - 5.1 mmol/L 4.1 - 4.3  Chloride 101 - 111 mmol/L 103 - 103  CO2 22 - 32 mmol/L 26 - 27  Calcium 8.9 - 10.3 mg/dL 9.5 - 81.110.4  Total Protein 6.0 - 8.3 g/dL - 6.4 7.0  Total Bilirubin 0.3 - 1.2 mg/dL - 0.4 0.3  Alkaline Phos 39 - 117 U/L - 56 82  AST 0 - 37 U/L - 25 23  ALT 0 - 35 U/L - 22 21    Imaging: Xr Hip Unilat W Or  W/o Pelvis 2-3 Views Right  Result Date: 07/03/2017 No hip joint narrowing was noted.  No SI joint changes were noted. Impression:unremarkable x-ray of the hip joint.  Xr Foot 2 Views Left  Result Date: 07/03/2017 First MTP narrowing and subluxation with cystic changes was noted.  Second MTP narrowing was noted.  All PIP and DIP narrowing was noted.  Dorsal spurring was noted.  No erosive changes were noted. Impression: These findings are consistent with osteoarthritis of the foot.  Although the narrowing of the second MTP raises concern of inflammatory arthritis.  Xr Foot 2 Views Right  Result Date: 07/03/2017 First MTP narrowing and subluxation was noted.  Severe narrowing of the second MTP joint was noted.  Without any erosive changes.  PIP and DIP narrowing without any erosive changes were noted. Impression: These findings are consistent with osteoarthritis.  The second MTP narrowing raises the concern of inflammatory arthritis.  Xr Hand 2 View Left  Result Date: 07/03/2017 Minimal PIP DIP narrowing was noted.  No MCP, intercarpal or radiocarpal joint space narrowing was noted.  Mild CMC narrowing was noted. Impression: These findings are consistent with osteoarthritis of the hand.  Xr Hand 2 View Right  Result Date: 07/03/2017 PIP DIP narrowing was noted.  Subluxation of the fourth DIP joint was noted.  First MCP narrowing was noted. CMC narrowing was noted.  No intercarpal radiocarpal joint space narrowing was noted. Impression: These findings are consistent with osteoarthritis of the hand.  Xr Lumbar Spine 2-3 Views  Result Date: 07/03/2017 Mild dextroscoliosis was noted.  Anterior spurring was noted.  L1-2, L2-3, L3-4 narrowing was noted.  Facet joint arthropathy was noted. Impression: These findings are consistent with spinal spondylosis and facet joint arthropathy.   Speciality Comments: No specialty comments available.    Procedures:  No procedures performed Allergies: Penicillins  and Alpha-gal   Assessment / Plan:     Visit Diagnoses: No diagnosis found.    Orders: No orders of the defined types were placed in this encounter.  No orders of the defined types were placed in this encounter.   Face-to-face time spent with patient was *** minutes. 50% of time was spent in counseling and coordination of care.  Follow-Up Instructions: No Follow-up on file.   Gearldine Bienenstockaylor M Danah Reinecke, PA-C  Note - This record has been created using Dragon software.  Chart creation errors have been sought, but may not always  have been located. Such creation errors do not reflect on  the standard of medical care.

## 2017-07-22 ENCOUNTER — Ambulatory Visit (INDEPENDENT_AMBULATORY_CARE_PROVIDER_SITE_OTHER): Payer: Self-pay

## 2017-07-22 ENCOUNTER — Ambulatory Visit (INDEPENDENT_AMBULATORY_CARE_PROVIDER_SITE_OTHER): Payer: BLUE CROSS/BLUE SHIELD | Admitting: Rheumatology

## 2017-07-22 DIAGNOSIS — M79671 Pain in right foot: Secondary | ICD-10-CM | POA: Diagnosis not present

## 2017-07-22 DIAGNOSIS — M79672 Pain in left foot: Secondary | ICD-10-CM

## 2017-07-22 NOTE — Progress Notes (Signed)
Ultrasound examination of bilateral feet was performed per EULAR recommendations. Using 12 MHz transducer, grayscale and power Doppler bilateral first, second, fourth, and fifth MTP joints both dorsal and plantar aspects were evaluated to look for synovitis or tenosynovitis. The findings were there was no synovitis or tenosynovitis on ultrasound examination. Narrowing of bilateral first and second MTPs was noted.  Impression: Ultrasound examination of bilateral feet revealed osteoarthritis , no synovitis was noted.

## 2017-07-31 ENCOUNTER — Ambulatory Visit: Payer: BLUE CROSS/BLUE SHIELD | Admitting: Rheumatology

## 2017-08-13 ENCOUNTER — Ambulatory Visit (INDEPENDENT_AMBULATORY_CARE_PROVIDER_SITE_OTHER): Payer: BLUE CROSS/BLUE SHIELD | Admitting: Podiatry

## 2017-08-13 ENCOUNTER — Ambulatory Visit (INDEPENDENT_AMBULATORY_CARE_PROVIDER_SITE_OTHER): Payer: BLUE CROSS/BLUE SHIELD

## 2017-08-13 ENCOUNTER — Other Ambulatory Visit: Payer: Self-pay | Admitting: Podiatry

## 2017-08-13 ENCOUNTER — Encounter: Payer: Self-pay | Admitting: Podiatry

## 2017-08-13 DIAGNOSIS — M79672 Pain in left foot: Secondary | ICD-10-CM | POA: Diagnosis not present

## 2017-08-13 DIAGNOSIS — M779 Enthesopathy, unspecified: Secondary | ICD-10-CM

## 2017-08-13 DIAGNOSIS — M2041 Other hammer toe(s) (acquired), right foot: Secondary | ICD-10-CM | POA: Diagnosis not present

## 2017-08-13 DIAGNOSIS — M2042 Other hammer toe(s) (acquired), left foot: Secondary | ICD-10-CM

## 2017-08-13 DIAGNOSIS — M21621 Bunionette of right foot: Secondary | ICD-10-CM | POA: Diagnosis not present

## 2017-08-13 DIAGNOSIS — M21619 Bunion of unspecified foot: Secondary | ICD-10-CM | POA: Diagnosis not present

## 2017-08-13 DIAGNOSIS — M79671 Pain in right foot: Secondary | ICD-10-CM

## 2017-08-13 NOTE — Progress Notes (Signed)
   Subjective:    Patient ID: [name], female    DOB: July 16, 1950, 67 y.o.   MRN: [id number]  HPI    Review of Systems  All other systems reviewed and are negative.      Objective:   Physical Exam        Assessment & Plan:

## 2017-08-13 NOTE — Patient Instructions (Addendum)
Bunion A bunion is a bump on the base of the big toe that forms when the bones of the big toe joint move out of position. Bunions may be small at first, but they often get larger over time. The can make walking painful. What are the causes? A bunion may be caused by:  Wearing narrow or pointed shoes that force the big toe to press against the other toes.  Abnormal foot development that causes the foot to roll inward (pronate).  Changes in the foot that are caused by certain diseases, such as rheumatoid arthritis and polio.  A foot injury.  What increases the risk? The following factors may make you more likely to develop this condition:  Wearing shoes that squeeze the toes together.  Having certain diseases, such as: ? Rheumatoid arthritis. ? Polio. ? Cerebral palsy.  Having family members who have bunions.  Being born with a foot deformity, such as flat feet or low arches.  Doing activities that put a lot of pressure on the feet, such as ballet dancing.  What are the signs or symptoms? The main symptom of a bunion is a noticeable bump on the big toe. Other symptoms may include:  Pain.  Swelling around the big toe.  Redness and inflammation.  Thick or hardened skin on the big toe or between the toes.  Stiffness or loss of motion in the big toe.  Trouble with walking.  How is this diagnosed? A bunion may be diagnosed based on your symptoms, medical history, and activities. You may have tests, such as:  X-rays. These allow your health care provider to check the position of the bones in your foot and look for damage to your joint. They also help your health care provider to determine the severity of your bunion and the best way to treat it.  Joint aspiration. In this test, a sample of fluid is removed from the toe joint. This test, which may be done if you are in a lot of pain, helps to rule out diseases that cause painful swelling of the joints, such as  arthritis.  How is this treated? There is no cure for a bunion, but treatment can help to prevent a bunion from getting worse. Treatment depends on the severity of your symptoms. Your health care provider may recommend:  Wearing shoes that have a wide toe box.  Using bunion pads to cushion the affected area.  Taping your toes together to keep them in a normal position.  Placing a device inside your shoe (orthotics) to help reduce pressure on your toe joint.  Taking medicine to ease pain, inflammation, and swelling.  Applying heat or ice to the affected area.  Doing stretching exercises.  Surgery to remove scar tissue and move the toes back into their normal position. This treatment is rare.  Follow these instructions at home:  Support your toe joint with proper footwear, shoe padding, or taping as told by your health care provider.  Take over-the-counter and prescription medicines only as told by your health care provider.  If directed, apply ice to the injured area: ? Put ice in a plastic bag. ? Place a towel between your skin and the bag. ? Leave the ice on for 20 minutes, 2-3 times per day.  If directed, apply heat to the affected area before you exercise. Use the heat source that your health care provider recommends, such as a moist heat pack or a heating pad. ? Place a towel between your   skin and the heat source. ? Leave the heat on for 20-30 minutes. ? Remove the heat if your skin turns bright red. This is especially important if you are unable to feel pain, heat, or cold. You may have a greater risk of getting burned.  Do exercises as told by your health care provider.  Keep all follow-up visits as told by your health care provider. Contact a health care provider if:  Your symptoms get worse.  Your symptoms do not improve in 2 weeks. Get help right away if:  You have severe pain and trouble with walking. This information is not intended to replace advice given  to you by your health care provider. Make sure you discuss any questions you have with your health care provider. Document Released: 05/19/2005 Document Revised: 10/25/2015 Document Reviewed: 12/17/2014 Elsevier Interactive Patient Education  2018 [employer]   Hammer Toe Hammer toe is a change in the shape (a deformity) of your second, third, or fourth toe. The deformity causes the middle joint of your toe to stay bent. This causes pain, especially when you are wearing shoes. Hammer toe starts gradually. At first, the toe can be straightened. Gradually over time, the deformity becomes stiff and permanent. Early treatments to keep the toe straight may relieve pain. As the deformity becomes stiff and permanent, surgery may be needed to straighten the toe. What are the causes? Hammer toe is caused by abnormal bending of the toe joint that is closest to your foot. It happens gradually over time. This pulls on the muscles and connections (tendons) of the toe joint, making them weak and stiff. It is often related to wearing shoes that are too short or narrow and do not let your toes straighten. What increases the risk? You may be at greater risk for hammer toe if you:  Are female.  Are older.  Wear shoes that are too small.  Wear high-heeled shoes that pinch your toes.  Are a ballet dancer.  Have a second toe that is longer than your big toe (first toe).  Injure your foot or toe.  Have arthritis.  Have a family history of hammer toe.  Have a nerve or muscle disorder.  What are the signs or symptoms? The main symptoms of this condition are pain and deformity of the toe. The pain is worse when wearing shoes, walking, or running. Other symptoms may include:  Corns or calluses over the bent part of the toe or between the toes.  Redness and a burning feeling on the toe.  An open sore that forms on the top of the toe.  Not being able to straighten the toe.  How is this  diagnosed? This condition is diagnosed based on your symptoms and a physical exam. During the exam, your health care provider will try to straighten your toe to see how stiff the deformity is. You may also have tests, such as:  A blood test to check for rheumatoid arthritis.  An X-ray to show how severe the deformity is.  How is this treated? Treatment for this condition will depend on how stiff the deformity is. Surgery is often needed. However, sometimes a hammer toe can be straightened without surgery. Treatments that do not involve surgery include:  Taping the toe into a straightened position.  Using pads and cushions to protect the toe (orthotics).  Wearing shoes that provide enough room for the toes.  Doing toe-stretching exercises at home.  Taking an NSAID to reduce pain and   swelling.  If these treatments do not help or the toe cannot be straightened, surgery is the next option. The most common surgeries used to straighten a hammer toe include:  Arthroplasty. In this procedure, part of the joint is removed, and that allows the toe to straighten.  Fusion. In this procedure, cartilage between the two bones of the joint is taken out and the bones are fused together into one longer bone.  Implantation. In this procedure, part of the bone is removed and replaced with an implant to let the toe move again.  Flexor tendon transfer. In this procedure, the tendons that curl the toes down (flexor tendons) are repositioned.  Follow these instructions at home:  Take over-the-counter and prescription medicines only as told by your health care provider.  Do toe straightening and stretching exercises as told by your health care provider.  Keep all follow-up visits as told by your health care provider. This is important. How is this prevented?  Wear shoes that give your toes enough room and do not cause pain.  Do not wear high-heeled shoes. Contact a health care provider if:  Your  pain gets worse.  Your toe becomes red or swollen.  You develop an open sore on your toe. This information is not intended to replace advice given to you by your health care provider. Make sure you discuss any questions you have with your health care provider. Document Released: 05/16/2000 Document Revised: 12/07/2015 Document Reviewed: 09/12/2015 Elsevier Interactive Patient Education  2018 [employer]   

## 2017-08-17 NOTE — Progress Notes (Signed)
Subjective:   Patient ID: [name], female   DOB: 67 y.o.   MRN: [id number]   HPI Patient presents stating she is concerned about painful bunions of both feet and digital deformities between the second and third toes of both feet along with tailor's bunion deformity.  Patient states is become increasingly painful for her and that she is tried wider shoes and she is tried padding without relief of her symptoms and gradually did become more more difficult to wear shoe gear or to pad herself.  Patient does not smoke and likes to be active   Review of Systems  All other systems reviewed and are negative.       Objective:  Physical Exam  Constitutional: She appears well-developed and well-nourished.  Cardiovascular: Intact distal pulses.  Pulmonary/Chest: Effort normal.  Musculoskeletal: Normal range of motion.  Neurological: She is alert.  Skin: Skin is warm.  Nursing note and vitals reviewed.   Neurovascular status intact muscle strength is adequate range of motion within normal limits with patient noted to have significant structural bunion deformity bilateral that are red and painful lesions between the second and third digits of both feet with pain and tailor's bunion deformity fifth metatarsal bilateral with redness and pain with palpation.  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  I do think patients get a need to consider structural correction and while I will be able to get full correction I do think distal osteotomies would be in her best interest.  At this point I reviewed everything that will be necessary including digital correction and patient wants surgery but needs to work on date and will come in prior to surgery to go over in greater  detail.  Patient will be seen back for us to recheck  X-rays indicate significant structural malalignment of the first second metatarsal and the fourth and fifth metatarsal of both feet with digital deformity bilateral

## 2017-09-21 ENCOUNTER — Encounter: Payer: Self-pay | Admitting: Podiatry

## 2017-09-21 ENCOUNTER — Ambulatory Visit (INDEPENDENT_AMBULATORY_CARE_PROVIDER_SITE_OTHER): Payer: BLUE CROSS/BLUE SHIELD | Admitting: Podiatry

## 2017-09-21 ENCOUNTER — Ambulatory Visit (INDEPENDENT_AMBULATORY_CARE_PROVIDER_SITE_OTHER): Payer: BLUE CROSS/BLUE SHIELD

## 2017-09-21 DIAGNOSIS — S99921A Unspecified injury of right foot, initial encounter: Secondary | ICD-10-CM

## 2017-09-21 DIAGNOSIS — R600 Localized edema: Secondary | ICD-10-CM | POA: Diagnosis not present

## 2017-09-21 DIAGNOSIS — S93401A Sprain of unspecified ligament of right ankle, initial encounter: Secondary | ICD-10-CM | POA: Diagnosis not present

## 2017-09-23 NOTE — Progress Notes (Signed)
Subjective:   Patient ID: [name], female   DOB: 67 y.o.   MRN: [id number]   HPI Patient states that she fell on her right ankle yesterday and is been very sore and swollen and she is due to go on a trip on Friday.  Did not feel any cracking noise but it does feel relatively unstable   ROS      Objective:  Physical Exam  Neurovascular status unchanged negative Homans sign noted with quite a bit of swelling in the right lateral ankle mostly around the anterior and posterior talofibular ligaments with difficult to make a determination of instability due to the pain and swelling that is present     Assessment:  Severe ankle sprain right with possibility for fracture with instability     Plan:  H&P x-rays reviewed and today applied an Unna boot Ace wrap in order to try to control swelling and will leave on for 3 days and then follow with a Tri-Lock ankle brace to provide complete ankle stability and this was dispensed with instructions on usage.  Patient will be seen back if symptoms persist and may ultimately require MRI to understand ligament pathology  X-rays were negative for signs of fracture or diastases injury

## 2017-09-24 ENCOUNTER — Ambulatory Visit: Payer: BLUE CROSS/BLUE SHIELD | Admitting: Rheumatology

## 2017-09-25 ENCOUNTER — Telehealth: Payer: Self-pay | Admitting: Podiatry

## 2017-09-25 NOTE — Telephone Encounter (Signed)
Pt states airlines states [employer] states she needs a medical note stating she has medical injury that will not allow to make the trip. Pt states [employer] is sending them forms to complete and she will bring or send to me.

## 2017-09-25 NOTE — Telephone Encounter (Signed)
Patient wants a medical note about ankle injury so she can show proof to airline to receive money back from her tickets shes paid. If you can call her back at [phone]

## 2017-10-01 NOTE — Telephone Encounter (Signed)
[employer] forms completed for Claim No. [id number] completed and copied for our records, clinicals of 09/21/2017 copied and give to pt with note to send with the forms if she liked. Pasty Spillers - clerical supervisor states she spoke with pt this morning and will call her to pick forms up.

## 2019-03-07 ENCOUNTER — Other Ambulatory Visit: Payer: Self-pay | Admitting: Podiatry

## 2019-03-07 ENCOUNTER — Ambulatory Visit (INDEPENDENT_AMBULATORY_CARE_PROVIDER_SITE_OTHER): Payer: Medicare Other

## 2019-03-07 ENCOUNTER — Ambulatory Visit (INDEPENDENT_AMBULATORY_CARE_PROVIDER_SITE_OTHER): Payer: Medicare Other | Admitting: Podiatry

## 2019-03-07 ENCOUNTER — Other Ambulatory Visit: Payer: Self-pay

## 2019-03-07 ENCOUNTER — Encounter: Payer: Self-pay | Admitting: Podiatry

## 2019-03-07 DIAGNOSIS — M722 Plantar fascial fibromatosis: Secondary | ICD-10-CM | POA: Diagnosis not present

## 2019-03-07 DIAGNOSIS — M21622 Bunionette of left foot: Secondary | ICD-10-CM

## 2019-03-07 DIAGNOSIS — M2041 Other hammer toe(s) (acquired), right foot: Secondary | ICD-10-CM

## 2019-03-07 DIAGNOSIS — M2042 Other hammer toe(s) (acquired), left foot: Secondary | ICD-10-CM

## 2019-03-07 DIAGNOSIS — M79672 Pain in left foot: Secondary | ICD-10-CM

## 2019-03-07 DIAGNOSIS — M21619 Bunion of unspecified foot: Secondary | ICD-10-CM

## 2019-03-07 DIAGNOSIS — M21621 Bunionette of right foot: Secondary | ICD-10-CM

## 2019-03-07 NOTE — Progress Notes (Signed)
Subjective:   Patient ID: Arma Heading, female   DOB: 68 y.o.   MRN: [id number]   HPI Patient presents stating she developed a lump on the bottom of her left arch and also she knows she needs her bunions fixed and that the right one does bother her more but they both give her trouble and she has a bunion on both sides plus hammertoe deformity second and third right foot   ROS      Objective:  Physical Exam  Neurovascular status intact with patient found to have a 1 cm x 1 cm mass in the right plantar arch that is what appears to be attached to the plantar fascia and does not appear to have free subcutaneous movement with structural bunion deformity bilateral and tailor's bunion deformity bilateral along with hammertoe deformity second and third right     Assessment:  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  Patient will be seen back to recheck

## 2019-04-22 ENCOUNTER — Telehealth: Payer: Self-pay | Admitting: Hematology

## 2019-04-22 NOTE — Telephone Encounter (Signed)
Received a new patient referral from Dr. Joylene Draft for decreased wbc. Pt has been cld and scheduled ot see Dr. Irene Limbo on 12/3 at 10am. Pt aware to arrive 15 minutes early.

## 2019-04-25 ENCOUNTER — Other Ambulatory Visit: Payer: Self-pay

## 2019-04-25 DIAGNOSIS — E785 Hyperlipidemia, unspecified: Secondary | ICD-10-CM

## 2019-05-05 ENCOUNTER — Other Ambulatory Visit: Payer: Self-pay

## 2019-05-05 ENCOUNTER — Telehealth: Payer: Self-pay | Admitting: Hematology

## 2019-05-05 ENCOUNTER — Inpatient Hospital Stay: Payer: Medicare Other

## 2019-05-05 ENCOUNTER — Inpatient Hospital Stay: Payer: Medicare Other | Attending: Hematology | Admitting: Hematology

## 2019-05-05 VITALS — BP 123/83 | HR 88 | Temp 98.3°F | Resp 18 | Ht 62.5 in | Wt 127.8 lb

## 2019-05-05 DIAGNOSIS — E063 Autoimmune thyroiditis: Secondary | ICD-10-CM

## 2019-05-05 DIAGNOSIS — D709 Neutropenia, unspecified: Secondary | ICD-10-CM

## 2019-05-05 DIAGNOSIS — Z87891 Personal history of nicotine dependence: Secondary | ICD-10-CM | POA: Diagnosis not present

## 2019-05-05 DIAGNOSIS — D510 Vitamin B12 deficiency anemia due to intrinsic factor deficiency: Secondary | ICD-10-CM

## 2019-05-05 DIAGNOSIS — D7589 Other specified diseases of blood and blood-forming organs: Secondary | ICD-10-CM

## 2019-05-05 LAB — CBC WITH DIFFERENTIAL/PLATELET
Abs Immature Granulocytes: 0 10*3/uL (ref 0.00–0.07)
Basophils Absolute: 0.1 10*3/uL (ref 0.0–0.1)
Basophils Relative: 2 %
Eosinophils Absolute: 0.1 10*3/uL (ref 0.0–0.5)
Eosinophils Relative: 4 %
HCT: 40.9 % (ref 36.0–46.0)
Hemoglobin: 14 g/dL (ref 12.0–15.0)
Immature Granulocytes: 0 %
Lymphocytes Relative: 34 %
Lymphs Abs: 0.9 10*3/uL (ref 0.7–4.0)
MCH: 33.8 pg (ref 26.0–34.0)
MCHC: 34.2 g/dL (ref 30.0–36.0)
MCV: 98.8 fL (ref 80.0–100.0)
Monocytes Absolute: 0.3 10*3/uL (ref 0.1–1.0)
Monocytes Relative: 11 %
Neutro Abs: 1.3 10*3/uL — ABNORMAL LOW (ref 1.7–7.7)
Neutrophils Relative %: 49 %
Platelets: 175 10*3/uL (ref 150–400)
RBC: 4.14 MIL/uL (ref 3.87–5.11)
RDW: 11.8 % (ref 11.5–15.5)
WBC: 2.6 10*3/uL — ABNORMAL LOW (ref 4.0–10.5)
nRBC: 0 % (ref 0.0–0.2)

## 2019-05-05 LAB — LACTATE DEHYDROGENASE: LDH: 194 U/L — ABNORMAL HIGH (ref 98–192)

## 2019-05-05 LAB — CMP (CANCER CENTER ONLY)
ALT: 17 U/L (ref 0–44)
AST: 21 U/L (ref 15–41)
Albumin: 4.2 g/dL (ref 3.5–5.0)
Alkaline Phosphatase: 93 U/L (ref 38–126)
Anion gap: 8 (ref 5–15)
BUN: 10 mg/dL (ref 8–23)
CO2: 28 mmol/L (ref 22–32)
Calcium: 9.2 mg/dL (ref 8.9–10.3)
Chloride: 108 mmol/L (ref 98–111)
Creatinine: 0.81 mg/dL (ref 0.44–1.00)
GFR, Est AFR Am: >60 mL/min (ref >60)
GFR, Estimated: >60 mL/min (ref >60)
Glucose, Bld: 92 mg/dL (ref 70–99)
Potassium: 4.2 mmol/L (ref 3.5–5.1)
Sodium: 144 mmol/L (ref 135–145)
Total Bilirubin: <0.2 mg/dL — ABNORMAL LOW (ref 0.3–1.2)
Total Protein: 6.6 g/dL (ref 6.5–8.1)

## 2019-05-05 NOTE — Progress Notes (Signed)
HEMATOLOGY/ONCOLOGY CONSULTATION NOTE  Date of Service: 05/05/2019  Patient Care Team: Rodrigo Ran, MD as PCP - General (Internal Medicine)  REFERRING PHYSICIAN: Rodrigo Ran, MD  CHIEF COMPLAINTS/PURPOSE OF CONSULTATION:  Decreased WBC, Leukopenia    HISTORY OF PRESENTING ILLNESS:   [name] is a wonderful 68 y.o. female who has been referred to [place] by Rodrigo Ran, MD for evaluation and management of Leukopenia. The pt reports that she is doing well overall.    The pt reports that several years ago Her PCP Dr. Waynard Edwards began to notice that her WBC were low.   She was also told that she is also low in vitamin B (B12 at 403) and she is now take B12 supplements.      She has not noticed any new symptoms other than stomach bloating that she correlated to her recent weight gain.  She has had a hypothyroid since she was in her 30s. She does not know the cause. It has been stable.  Pt has osteoarthritis and a knee problem   She it taking Zyrtec because of year round itching. She has not had any allergy testing.   She it taking a daily iron supplement.  She was under the impression that she may have a thyroid tumor and that she needed a brain scan.   Most recent lab results (04/21/19) of CBC is as follows: all values are WNL except for WBC at 2.81.  On review of systems, pt reports she is doing well and denies infections, change in energy, vision changes, headaches, fevers, chills, night sweets, bleeding, Family Hx of autoimmune disorders, mouth sores,back pain, abdominal pain, leg swelling and any other symptoms.   On Social Hx the pt reports no alcohol consumption. Previous smoker. Quit in her 32's.  MEDICAL HISTORY:  Past Medical History:  Diagnosis Date  . Attention deficit disorder (ADD)   . 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 Depression   . GERD (gastroesophageal reflux disease)   . High cholesterol   . History of anemia   . Hypothyroidism   .  Osteoarthritis      SURGICAL HISTORY: Past Surgical History:  Procedure Laterality Date  . CARPAL TUNNEL RELEASE  02/03/2012   Procedure: CARPAL TUNNEL RELEASE;  Surgeon: Wyn Forster., MD;  Location: [facility];  Service: Orthopedics;  Laterality: Left;  . CARPAL TUNNEL RELEASE  04/20/2012   Procedure: CARPAL TUNNEL RELEASE;  Surgeon: Wyn Forster., MD;  Location: [facility];  Service: Orthopedics;  Laterality: Right;  RIGHT CARPAL TUNNEL RELEASE   . COMBINED HYSTEROSCOPY DIAGNOSTIC / D&C  06/29/2001   with resectoscopic polypectomy  . I&D EXTREMITY Bilateral 09/23/2016   Procedure: IRRIGATION AND DEBRIDEMENT BILATERAL ARMS;  Surgeon: Dominica Severin, MD;  Location: [facility];  Service: Orthopedics;  Laterality: Bilateral;  . TRIGGER FINGER RELEASE  04/20/2012   Procedure: RELEASE TRIGGER FINGER/A-1 PULLEY;  Surgeon: Wyn Forster., MD;  Location: [facility];  Service: Orthopedics;  Laterality: Right;  RIGHT LONG A-1 RELEASE, RIGHT RING A-1 PULLEY RELEASE      SOCIAL HISTORY: Social History   Socioeconomic History  . Marital status: Married    Spouse name: Not on file  . Number of children: 1  . Years of education: Not on file  . Highest education level: Not on file  Occupational History  . Not on file  Social Needs  . Financial resource strain: Not on file  .  Food insecurity    Worry: Not on file    Inability: Not on file  . Transportation needs    Medical: Not on file    Non-medical: Not on file  Tobacco Use  . Smoking status: Former Research scientist (life sciences)  . Smokeless tobacco: Never Used  . Tobacco comment: Quit 1983  Substance and Sexual Activity  . Alcohol use: No    Alcohol/week: 0.0 standard drinks  . Drug use: No  . Sexual activity: Not on file  Lifestyle  . Physical activity    Days per week: Not on file    Minutes per session: Not on file  . Stress: Not on file  Relationships  . Social Herbalist on  phone: Not on file    Gets together: Not on file    Attends religious service: Not on file    Active member of club or organization: Not on file    Attends meetings of clubs or organizations: Not on file    Relationship status: Not on file  . Intimate partner violence    Fear of current or ex partner: Not on file    Emotionally abused: Not on file    Physically abused: Not on file    Forced sexual activity: Not on file  Other Topics Concern  . Not on file  Social History Narrative   Lives at home with husband and daughter.       FAMILY HISTORY: Family History  Problem Relation Age of Onset  . Alzheimer's disease Father   . Alcoholism Mother      ALLERGIES:   is allergic to penicillins and alpha-gal.   MEDICATIONS:  Current Outpatient Medications  Medication Sig Dispense Refill  . ALPRAZolam (XANAX) 0.25 MG tablet Take 0.25 tablets by mouth as needed.  2  . Ascorbic Acid (VITAMIN C) 1000 MG tablet Take 2,000 mg by mouth 2 (two) times daily.    Marland Kitchen atorvastatin (LIPITOR) 40 MG tablet Take 40 mg by mouth daily.    . butalbital-aspirin-caffeine (FIORINAL) 50-325-40 MG capsule Take 50 capsules by mouth as needed.  0  . calcium citrate-vitamin D 200-200 MG-UNIT TABS Take 1 tablet by mouth daily.    . cetirizine (ZYRTEC) 10 MG tablet Take 10 mg by mouth daily.    . cholecalciferol (VITAMIN D) 1000 units tablet Take 1,000 Units by mouth 2 (two) times daily.    Marland Kitchen Co-Enzyme Q-10 30 MG CAPS Take 200 mg by mouth daily.    Marland Kitchen dexlansoprazole (DEXILANT) 60 MG capsule Take 60 mg by mouth daily.    Marland Kitchen EPINEPHrine 0.3 mg/0.3 mL IJ SOAJ injection epinephrine 0.3 mg/0.3 mL injection, auto-injector  USE AS NEEDED AND AS DIRECTED FOR SEVERE ALLERGIC REACTION    . escitalopram (LEXAPRO) 10 MG tablet Take 10 mg by mouth daily.    . ferrous sulfate 325 (65 FE) MG tablet Take 325 mg by mouth daily with breakfast.    . Levothyroxine Sodium 100 MCG CAPS Take 100 mcg by mouth daily.     . Multiple  Vitamins-Minerals (MULTIVITAMIN ADULT PO) Take by mouth daily.    Marland Kitchen PROAIR RESPICLICK 588 (90 Base) MCG/ACT AEPB INHALE 1 2 PUFFS AS NEEDED EVERY 4 6 HOURS FOR COUGHING / WHEEZING     No current facility-administered medications for this visit.      REVIEW OF SYSTEMS:   A 10+ POINT REVIEW OF SYSTEMS WAS OBTAINED including neurology, dermatology, psychiatry, cardiac, respiratory, lymph, extremities, GI, GU, Musculoskeletal, constitutional, breasts, reproductive,  HEENT.  All pertinent positives are noted in the HPI.  All others are negative.    PHYSICAL EXAMINATION: ECOG PERFORMANCE STATUS: 1 - Symptomatic but completely ambulatory  Vitals:   05/05/19 1021  BP: 123/83  Pulse: 88  Resp: 18  Temp: 98.3 F (36.8 C)  SpO2: 95%   Filed Weights   05/05/19 1021  Weight: 127 lb 12.8 oz (58 kg)   Body mass index is 23 kg/m.  GENERAL:alert, in no acute distress and comfortable SKIN: no acute rashes, no significant lesions EYES: conjunctiva are pink and non-injected, sclera anicteric OROPHARYNX: MMM, no exudates, no oropharyngeal erythema or ulceration NECK: supple, no JVD LYMPH:  no palpable lymphadenopathy in the cervical, axillary or inguinal regions LUNGS: clear to auscultation b/l with normal respiratory effort HEART: regular rate & rhythm ABDOMEN:  normoactive bowel sounds , non tender, not distended. Extremity: no pedal edema PSYCH: alert & oriented x 3 with fluent speech NEURO: no focal motor/sensory deficits   LABORATORY DATA:  I have reviewed the data as listed 04/15/2019   04/12/2019   CBC Latest Ref Rng & Units 09/23/2016 04/20/2012 02/03/2012  WBC 4.0 - 10.5 K/uL 11.3(H) - -  Hemoglobin 12.0 - 15.0 g/dL 32.414.9 40.113.5 02.714.1  Hematocrit 36.0 - 46.0 % 43.3 - -  Platelets 150 - 400 K/uL 188 - -    CMP Latest Ref Rng & Units 09/23/2016 08/21/2010 05/29/2009  Glucose 65 - 99 mg/dL 253(G115(H) - 92  BUN 6 - 20 mg/dL 16 - 17  Creatinine 6.440.44 - 1.00 mg/dL 0.340.84 - 7.420.79  Sodium  595135 - 145 mmol/L 137 - 142  Potassium 3.5 - 5.1 mmol/L 4.1 - 4.3  Chloride 101 - 111 mmol/L 103 - 103  CO2 22 - 32 mmol/L 26 - 27  Calcium 8.9 - 10.3 mg/dL 9.5 - 63.810.4  Total Protein 6.0 - 8.3 g/dL - 6.4 7.0  Total Bilirubin 0.3 - 1.2 mg/dL - 0.4 0.3  Alkaline Phos 39 - 117 U/L - 56 82  AST 0 - 37 U/L - 25 23  ALT 0 - 35 U/L - 22 21    RADIOGRAPHIC STUDIES: I have personally reviewed the radiological images as listed and agreed with the findings in the report. No results found.   ASSESSMENT & PLAN:  [name] is a 68 y.o. female with:  1. Mild isolated leucopenia/Neutropenia. No issues with frequent or severe infections.  PLAN: -Discussed patient's most recent labs from 04/12/2019, WBC at 2.81 -Discussed that hypothyroidism can associated with Vitamin B12 deficiency and autoimmune Leukopenia -she notes that Dr.Perini wants to monitor her TSH levels since they were low with norrmal FT4 levels. -Discussed labs today. Further treatment to be determined after lab results.   FOLLOW UP Labs today RTC with Dr Candise CheKale as needed based on labs  . Orders Placed This Encounter  Procedures  . CBC with Differential/Platelet    Standing Status:   Future    Number of Occurrences:   1    Standing Expiration Date:   06/08/2020  . CMP (Cancer Center only)    Standing Status:   Future    Number of Occurrences:   1    Standing Expiration Date:   05/04/2020  . Intrinsic factor antibodies    Standing Status:   Future    Number of Occurrences:   1    Standing Expiration Date:   05/04/2020  . Folate RBC    Standing Status:   Future  Number of Occurrences:   1    Standing Expiration Date:   05/04/2020  . Copper, serum    Standing Status:   Future    Number of Occurrences:   1    Standing Expiration Date:   05/04/2020  . Lactate dehydrogenase    Standing Status:   Future    Number of Occurrences:   1    Standing Expiration Date:   05/04/2020  . Anti-parietal antibody    Standing  Status:   Future    Number of Occurrences:   1    Standing Expiration Date:   05/04/2020     All of the patients questions were answered with apparent satisfaction. The patient knows to call the clinic with any problems, questions or concerns.  I spent 30 minutes counseling the patient face to face. The total time spent in the appointment was 45 minutes and more than 50% was on counseling and direct patient cares.    Wyvonnia Lora MD MS AAHIVMS [facility] Hematology/Oncology Physician [facility]  (Office):       [phone] (Work cell):  [phone] (Fax):           [phone]  05/05/2019 6:47 AM I, Charlotte Crumb, am acting as a scribe for Dr. Wyvonnia Lora.   .I have reviewed the above documentation for accuracy and completeness, and I agree with the above. Johney Maine MD   ADDENDUM  Component     Latest Ref Rng & Units 05/05/2019        11:40 AM  WBC     4.0 - 10.5 K/uL 2.6 (L)  RBC     3.87 - 5.11 MIL/uL 4.14  Hemoglobin     12.0 - 15.0 g/dL 28.4  HCT     13.2 - 44.0 % 40.9  MCV     80.0 - 100.0 fL 98.8  MCH     26.0 - 34.0 pg 33.8  MCHC     30.0 - 36.0 g/dL 10.2  RDW     72.5 - 36.6 % 11.8  Platelets     150 - 400 K/uL 175  nRBC     0.0 - 0.2 % 0.0  Neutrophils     % 49  NEUT#     1.7 - 7.7 K/uL 1.3 (L)  Lymphocytes     % 34  Lymphocyte #     0.7 - 4.0 K/uL 0.9  Monocytes Relative     % 11  Monocyte #     0.1 - 1.0 K/uL 0.3  Eosinophil     % 4  Eosinophils Absolute     0.0 - 0.5 K/uL 0.1  Basophil     % 2  Basophils Absolute     0.0 - 0.1 K/uL 0.1  Immature Granulocytes     % 0  Abs Immature Granulocytes     0.00 - 0.07 K/uL 0.00  Sodium     135 - 145 mmol/L 144  Potassium     3.5 - 5.1 mmol/L 4.2  Chloride     98 - 111 mmol/L 108  CO2     22 - 32 mmol/L 28  Glucose     70 - 99 mg/dL 92  BUN     8 - 23 mg/dL 10  Creatinine     4.40 - 1.00 mg/dL 3.47  Calcium     8.9 - 10.3 mg/dL 9.2  Total Protein     6.5  -  8.1 g/dL 6.6  Albumin     3.5 - 5.0 g/dL 4.2  AST     15 - 41 U/L 21  ALT     0 - 44 U/L 17  Alkaline Phosphatase     38 - 126 U/L 93  Total Bilirubin     0.3 - 1.2 mg/dL <8.1 (L)  GFR, Est Non African American     >60 mL/min >60  GFR, Est African American     >60 mL/min >60  Anion gap     5 - 15 8  Folate, Hemolysate     Not Estab. ng/mL   Folate, RBC     >498 ng/mL   Parietal Cell Antibody-IgG     0.0 - 20.0 Units 40.6 (H)  LDH     98 - 192 U/L   Copper     72 - 166 ug/dL 191  Intrinsic Factor     0.0 - 1.1 AU/mL 1.1    - Antiparietal cell ab-- consistent with pernicious anemia WBC count -s table. Other workup unrevealing. 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  RTC with Dr Candise Che with labs in 6 months

## 2019-05-05 NOTE — Telephone Encounter (Signed)
Gave avs sent to lab °

## 2019-05-06 LAB — FOLATE RBC
Folate, Hemolysate: 403 ng/mL
Folate, RBC: 985 ng/mL (ref >498)
Hematocrit: 40.9 % (ref 34.0–46.6)

## 2019-05-06 LAB — INTRINSIC FACTOR ANTIBODIES: Intrinsic Factor: 1.1 AU/mL (ref 0.0–1.1)

## 2019-05-06 LAB — ANTI-PARIETAL ANTIBODY: Parietal Cell Antibody-IgG: 40.6 Units — ABNORMAL HIGH (ref 0.0–20.0)

## 2019-05-10 LAB — COPPER, SERUM: Copper: 166 ug/dL (ref 72–166)

## 2019-05-12 ENCOUNTER — Telehealth: Payer: Self-pay

## 2019-05-12 NOTE — Telephone Encounter (Signed)
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 Other workup unrevealing. 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  RTC with Dr Irene Limbo with labs in 6 months with cbc/diff and cmp. Recommend OTC B complex in addition to her B12 replacement

## 2019-05-12 NOTE — Telephone Encounter (Addendum)
Per Dr. Irene Limbo TC to Pt to inform her of test results from lab work taken on 05/05/19 Informed Pt that the results from one of the blood test taken for Anti-parietal antibody test was abnormal (40.6) which is elevated, Per Dr. Irene Limbo this is consistent with pernicious anemia which I explained is an anemia that involves B12 deficiency. Also informed Pt  Per Dr. Irene Limbo her WBC count is stable and other work up was unrevealing. Explained to her that Dr. Irene Limbo states that the low wbc count is likely autoimmune neutropenia which is associated with the pernicious anemia and her hypothyroidism.Explained to her that  Dr Grier Mitts recommendation is for her to add over the counter B complex with an addition of B 12 as well. Informed her he wants her to return for a visit in 6 months with a Lab appointment. Pt. Verbalized understanding, And stated she was taking a 1000 of B complex and will be adding the B12. Informed her if she had any other problems or concerns she could give [place] a call.

## 2019-05-13 ENCOUNTER — Other Ambulatory Visit: Payer: Self-pay

## 2019-05-13 ENCOUNTER — Ambulatory Visit (INDEPENDENT_AMBULATORY_CARE_PROVIDER_SITE_OTHER)
Admission: RE | Admit: 2019-05-13 | Discharge: 2019-05-13 | Disposition: A | Discharge status: Home / Self Care | Payer: Medicare Other | Source: Ambulatory Visit | Attending: Cardiology | Admitting: Cardiology

## 2019-05-13 DIAGNOSIS — E785 Hyperlipidemia, unspecified: Secondary | ICD-10-CM

## 2019-05-30 ENCOUNTER — Ambulatory Visit: Payer: BLUE CROSS/BLUE SHIELD | Attending: Internal Medicine

## 2019-05-30 DIAGNOSIS — Z20822 Contact with and (suspected) exposure to covid-19: Secondary | ICD-10-CM

## 2019-06-01 LAB — NOVEL CORONAVIRUS, NAA: SARS-CoV-2, NAA: NOT DETECTED

## 2019-12-22 ENCOUNTER — Encounter: Payer: Self-pay | Admitting: Hematology

## 2019-12-23 ENCOUNTER — Other Ambulatory Visit: Payer: Self-pay | Admitting: *Deleted

## 2019-12-23 ENCOUNTER — Other Ambulatory Visit: Payer: Self-pay | Admitting: Obstetrics and Gynecology

## 2019-12-23 DIAGNOSIS — Z8639 Personal history of other endocrine, nutritional and metabolic disease: Secondary | ICD-10-CM

## 2019-12-23 DIAGNOSIS — N644 Mastodynia: Secondary | ICD-10-CM

## 2019-12-23 DIAGNOSIS — D7589 Other specified diseases of blood and blood-forming organs: Secondary | ICD-10-CM

## 2020-01-09 ENCOUNTER — Ambulatory Visit
Admission: RE | Admit: 2020-01-09 | Discharge: 2020-01-09 | Disposition: A | Discharge status: Home / Self Care | Payer: Medicare Other | Source: Ambulatory Visit | Attending: Obstetrics and Gynecology | Admitting: Obstetrics and Gynecology

## 2020-01-09 ENCOUNTER — Other Ambulatory Visit: Payer: Self-pay

## 2020-01-09 ENCOUNTER — Other Ambulatory Visit: Payer: Self-pay | Admitting: Obstetrics and Gynecology

## 2020-01-09 DIAGNOSIS — Z1231 Encounter for screening mammogram for malignant neoplasm of breast: Secondary | ICD-10-CM

## 2020-01-09 DIAGNOSIS — N644 Mastodynia: Secondary | ICD-10-CM

## 2020-01-27 ENCOUNTER — Encounter: Payer: Self-pay | Admitting: Hematology

## 2020-01-27 ENCOUNTER — Inpatient Hospital Stay: Payer: Medicare Other | Attending: Hematology | Admitting: Hematology

## 2020-01-27 ENCOUNTER — Other Ambulatory Visit: Payer: Self-pay

## 2020-01-27 ENCOUNTER — Inpatient Hospital Stay: Payer: Medicare Other

## 2020-01-27 VITALS — BP 122/81 | HR 85 | Temp 98.4°F | Resp 18 | Ht 62.5 in | Wt 122.0 lb

## 2020-01-27 DIAGNOSIS — D709 Neutropenia, unspecified: Secondary | ICD-10-CM

## 2020-01-27 DIAGNOSIS — Z8639 Personal history of other endocrine, nutritional and metabolic disease: Secondary | ICD-10-CM

## 2020-01-27 DIAGNOSIS — D7589 Other specified diseases of blood and blood-forming organs: Secondary | ICD-10-CM

## 2020-01-27 DIAGNOSIS — Z87891 Personal history of nicotine dependence: Secondary | ICD-10-CM | POA: Diagnosis not present

## 2020-01-27 LAB — CBC WITH DIFFERENTIAL (CANCER CENTER ONLY)
Abs Immature Granulocytes: 0.01 10*3/uL (ref 0.00–0.07)
Basophils Absolute: 0.1 10*3/uL (ref 0.0–0.1)
Basophils Relative: 2 %
Eosinophils Absolute: 0.1 10*3/uL (ref 0.0–0.5)
Eosinophils Relative: 2 %
HCT: 41.4 % (ref 36.0–46.0)
Hemoglobin: 14.2 g/dL (ref 12.0–15.0)
Immature Granulocytes: 0 %
Lymphocytes Relative: 29 %
Lymphs Abs: 0.8 10*3/uL (ref 0.7–4.0)
MCH: 33.9 pg (ref 26.0–34.0)
MCHC: 34.3 g/dL (ref 30.0–36.0)
MCV: 98.8 fL (ref 80.0–100.0)
Monocytes Absolute: 0.3 10*3/uL (ref 0.1–1.0)
Monocytes Relative: 10 %
Neutro Abs: 1.6 10*3/uL — ABNORMAL LOW (ref 1.7–7.7)
Neutrophils Relative %: 57 %
Platelet Count: 193 10*3/uL (ref 150–400)
RBC: 4.19 MIL/uL (ref 3.87–5.11)
RDW: 11.9 % (ref 11.5–15.5)
WBC Count: 2.9 10*3/uL — ABNORMAL LOW (ref 4.0–10.5)
nRBC: 0 % (ref 0.0–0.2)

## 2020-01-27 LAB — CMP (CANCER CENTER ONLY)
ALT: 25 U/L (ref 0–44)
AST: 28 U/L (ref 15–41)
Albumin: 4.6 g/dL (ref 3.5–5.0)
Alkaline Phosphatase: 89 U/L (ref 38–126)
Anion gap: 11 (ref 5–15)
BUN: 13 mg/dL (ref 8–23)
CO2: 25 mmol/L (ref 22–32)
Calcium: 9.7 mg/dL (ref 8.9–10.3)
Chloride: 104 mmol/L (ref 98–111)
Creatinine: 0.84 mg/dL (ref 0.44–1.00)
GFR, Est AFR Am: >60 mL/min (ref >60)
GFR, Estimated: >60 mL/min (ref >60)
Glucose, Bld: 90 mg/dL (ref 70–99)
Potassium: 4.2 mmol/L (ref 3.5–5.1)
Sodium: 140 mmol/L (ref 135–145)
Total Bilirubin: 0.7 mg/dL (ref 0.3–1.2)
Total Protein: 6.9 g/dL (ref 6.5–8.1)

## 2020-01-27 LAB — VITAMIN B12: Vitamin B-12: 1255 pg/mL — ABNORMAL HIGH (ref 180–914)

## 2020-01-27 NOTE — Progress Notes (Signed)
HEMATOLOGY/ONCOLOGY CONSULTATION NOTE  Date of Service: 01/27/2020  Patient Care Team: Rodrigo RanPerini, Mark, MD as PCP - General (Internal Medicine)  REFERRING PHYSICIAN: Rodrigo RanPerini, Mark, MD  CHIEF COMPLAINTS/PURPOSE OF CONSULTATION:  Decreased WBC, Leukopenia    HISTORY OF PRESENTING ILLNESS:   [name] is a wonderful 69 y.o. female who has been referred to us by Rodrigo RanPerini, Mark, MD for evaluation and management of Leukopenia. The pt reports that she is doing well overall.    The pt reports that several years ago Her PCP Dr. Waynard EdwardsPerini began to notice that her WBC were low.   She was also told that she is also low in vitamin B (B12 at 403) and she is now take B12 supplements.      She has not noticed any new symptoms other than stomach bloating that she correlated to her recent weight gain.  She has had a hypothyroid since she was in her 30s. She does not know the cause. It has been stable.  Pt has osteoarthritis and a knee problem   She it taking Zyrtec because of year round itching. She has not had any allergy testing.   She it taking a daily iron supplement.  She was under the impression that she may have a thyroid tumor and that she needed a brain scan.   Most recent lab results (04/21/19) of CBC is as follows: all values are WNL except for WBC at 2.81.  On review of systems, pt reports she is doing well and denies infections, change in energy, vision changes, headaches, fevers, chills, night sweets, bleeding, Family Hx of autoimmune disorders, mouth sores,back pain, abdominal pain, leg swelling and any other symptoms.   On Social Hx the pt reports no alcohol consumption. Previous smoker. Quit in her 2620's.  INTERVAL HISTORY:   [name] is a wonderful 69 y.o. female who is here for evaluation and management of Leukopenia. The patient's last visit with us was on 05/05/2019. The pt reports that she is doing well overall.  The pt reports that she feels well and has  no new concerns. She takes a multivitamin, Vitamin B12, Vitamin D, and Calcium daily. Pt takes Dexilant daily for acid reflux.  Lab results today (01/27/20) of CBC w/diff and CMP is as follows: all values are WNL except for WBC at 2.9K, Neutro Abs at 1.6K. 01/27/2020 Vitamin B12 at 1255  On review of systems, pt reports abdominal bloating and denies fevers, chills, night sweats, unexpected weight loss and any other symptoms.   MEDICAL HISTORY:  Past Medical History:  Diagnosis Date  . Attention deficit disorder (ADD)   . 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 Depression   . GERD (gastroesophageal reflux disease)   . High cholesterol   . History of anemia   . Hypothyroidism   . Osteoarthritis      SURGICAL HISTORY: Past Surgical History:  Procedure Laterality Date  . CARPAL TUNNEL RELEASE  02/03/2012   Procedure: CARPAL TUNNEL RELEASE;  Surgeon: Wyn Forsterobert V Sypher Jr., MD;  Location: [facility];  Service: Orthopedics;  Laterality: Left;  . CARPAL TUNNEL RELEASE  04/20/2012   Procedure: CARPAL TUNNEL RELEASE;  Surgeon: Wyn Forsterobert V Sypher Jr., MD;  Location: [facility];  Service: Orthopedics;  Laterality: Right;  RIGHT CARPAL TUNNEL RELEASE   . COMBINED HYSTEROSCOPY DIAGNOSTIC / D&C  06/29/2001   with resectoscopic polypectomy  . I & D EXTREMITY Bilateral 09/23/2016   Procedure: IRRIGATION AND  DEBRIDEMENT BILATERAL ARMS;  Surgeon: Dominica Severin, MD;  Location: [facility] OR;  Service: Orthopedics;  Laterality: Bilateral;  . TRIGGER FINGER RELEASE  04/20/2012   Procedure: RELEASE TRIGGER FINGER/A-1 PULLEY;  Surgeon: Wyn Forster., MD;  Location: [facility];  Service: Orthopedics;  Laterality: Right;  RIGHT LONG A-1 RELEASE, RIGHT RING A-1 PULLEY RELEASE      SOCIAL HISTORY: Social History   Socioeconomic History  . Marital status: Married    Spouse name: Not on file  . Number of children: 1  . Years of education: Not on file  .  Highest education level: Not on file  Occupational History  . Not on file  Tobacco Use  . Smoking status: Former Games developer  . Smokeless tobacco: Never Used  . Tobacco comment: Quit 1983  Vaping Use  . Vaping Use: Never used  Substance and Sexual Activity  . Alcohol use: No    Alcohol/week: 0.0 standard drinks  . Drug use: No  . Sexual activity: Not on file  Other Topics Concern  . Not on file  Social History Narrative   Lives at home with husband and daughter.     Social Determinants of Health   Financial Resource Strain:   . Difficulty of Paying Living Expenses: Not on file  Food Insecurity:   . Worried About Programme researcher, broadcasting/film/video in the Last Year: Not on file  . Ran Out of Food in the Last Year: Not on file  Transportation Needs:   . Lack of Transportation (Medical): Not on file  . Lack of Transportation (Non-Medical): Not on file  Physical Activity:   . Days of Exercise per Week: Not on file  . Minutes of Exercise per Session: Not on file  Stress:   . Feeling of Stress : Not on file  Social Connections:   . Frequency of Communication with Friends and Family: Not on file  . Frequency of Social Gatherings with Friends and Family: Not on file  . Attends Religious Services: Not on file  . Active Member of Clubs or Organizations: Not on file  . Attends Banker Meetings: Not on file  . Marital Status: Not on file  Intimate Partner Violence:   . Fear of Current or Ex-Partner: Not on file  . Emotionally Abused: Not on file  . Physically Abused: Not on file  . Sexually Abused: Not on file     FAMILY HISTORY: Family History  Problem Relation Age of Onset  . Alzheimer's disease Father   . Alcoholism Mother      ALLERGIES:   is allergic to penicillins and alpha-gal.   MEDICATIONS:  Current Outpatient Medications  Medication Sig Dispense Refill  . ALPRAZolam (XANAX) 0.25 MG tablet Take 0.25 tablets by mouth as needed.  2  . Ascorbic Acid (VITAMIN C) 1000  MG tablet Take 2,000 mg by mouth 2 (two) times daily.    Marland Kitchen atorvastatin (LIPITOR) 40 MG tablet Take 40 mg by mouth daily.    . butalbital-aspirin-caffeine (FIORINAL) 50-325-40 MG capsule Take 50 capsules by mouth as needed.  0  . calcium citrate-vitamin D 200-200 MG-UNIT TABS Take 1 tablet by mouth daily.    . cetirizine (ZYRTEC) 10 MG tablet Take 10 mg by mouth daily.    . cholecalciferol (VITAMIN D) 1000 units tablet Take 1,000 Units by mouth 2 (two) times daily.    Marland Kitchen Co-Enzyme Q-10 30 MG CAPS Take 200 mg by mouth daily.    Marland Kitchen dexlansoprazole (  DEXILANT) 60 MG capsule Take 60 mg by mouth daily.    Marland Kitchen EPINEPHrine 0.3 mg/0.3 mL IJ SOAJ injection epinephrine 0.3 mg/0.3 mL injection, auto-injector  USE AS NEEDED AND AS DIRECTED FOR SEVERE ALLERGIC REACTION    . escitalopram (LEXAPRO) 10 MG tablet Take 10 mg by mouth daily.    . ferrous sulfate 325 (65 FE) MG tablet Take 325 mg by mouth daily with breakfast.    . levothyroxine (SYNTHROID) 75 MCG tablet Take 75 mcg by mouth daily.    . Levothyroxine Sodium 100 MCG CAPS Take 75 mcg by mouth daily.     . Multiple Vitamins-Minerals (MULTIVITAMIN ADULT PO) Take by mouth daily.     No current facility-administered medications for this visit.     REVIEW OF SYSTEMS:   A 10+ POINT REVIEW OF SYSTEMS WAS OBTAINED including neurology, dermatology, psychiatry, cardiac, respiratory, lymph, extremities, GI, GU, Musculoskeletal, constitutional, breasts, reproductive, HEENT.  All pertinent positives are noted in the HPI.  All others are negative.   PHYSICAL EXAMINATION: ECOG PERFORMANCE STATUS: 1 - Symptomatic but completely ambulatory  Vitals:   01/27/20 1135  BP: 122/81  Pulse: 85  Resp: 18  Temp: 98.4 F (36.9 C)  SpO2: 98%   Filed Weights   01/27/20 1135  Weight: 122 lb (55.3 kg)   Body mass index is 21.96 kg/m.   GENERAL:alert, in no acute distress and comfortable SKIN: no acute rashes, no significant lesions EYES: conjunctiva are pink and  non-injected, sclera anicteric OROPHARYNX: MMM, no exudates, no oropharyngeal erythema or ulceration NECK: supple, no JVD LYMPH:  no palpable lymphadenopathy in the cervical, axillary or inguinal regions LUNGS: clear to auscultation b/l with normal respiratory effort HEART: regular rate & rhythm ABDOMEN:  normoactive bowel sounds , non tender, not distended. No palpable hepatosplenomegaly.  Extremity: no pedal edema PSYCH: alert & oriented x 3 with fluent speech NEURO: no focal motor/sensory deficits  LABORATORY DATA:  I have reviewed the data as listed  05/06/2019  04/15/2019   04/12/2019   CBC Latest Ref Rng & Units 01/27/2020 05/05/2019 05/05/2019  WBC 4.0 - 10.5 K/uL 2.9(L) 2.6(L) -  Hemoglobin 12.0 - 15.0 g/dL 60.1 09.3 -  Hematocrit 36 - 46 % 41.4 40.9 40.9  Platelets 150 - 400 K/uL 193 175 -    CMP Latest Ref Rng & Units 01/27/2020 05/05/2019 09/23/2016  Glucose 70 - 99 mg/dL 90 92 235(T)  BUN 8 - 23 mg/dL 13 10 16   Creatinine 0.44 - 1.00 mg/dL 7.32 2.02  Sodium 135 - 145 mmol/L 140 144 137  Potassium 3.5 - 5.1 mmol/L 4.2 4.2 4.1  Chloride 98 - 111 mmol/L 104 108 103  CO2 22 - 32 mmol/L 25 28 26   Calcium 8.9 - 10.3 mg/dL 9.7 9.2 9.5  Total Protein 6.5 - 8.1 g/dL 6.9 6.6 -  Total Bilirubin 0.3 - 1.2 mg/dL 0.7 5.42) -  Alkaline Phos 38 - 126 U/L 89 93 -  AST 15 - 41 U/L 28 21 -  ALT 0 - 44 U/L 25 17 -    RADIOGRAPHIC STUDIES: I have personally reviewed the radiological images as listed and agreed with the findings in the report. BREAST LTD UNI LEFT INC AXILLA  Result Date: 01/09/2020 CLINICAL DATA:  Patient complains of focal left breast pain. Patient had a benign skin punch biopsy in this area. EXAM: DIGITAL DIAGNOSTIC LEFT MAMMOGRAM WITH CAD AND TOMO ULTRASOUND LEFT BREAST COMPARISON:  Previous exam(s). ACR Breast Density Category b: There  are scattered areas of fibroglandular density. FINDINGS: No suspicious mass, malignant type microcalcifications or  distortion detected in the left breast. Spot tangential view of the area of clinical concern shows normal fibroglandular tissue. Mammographic images were processed with CAD. On physical exam, I do not palpate a discrete mass in left breast at 1 o'clock 4 cm from the nipple. Targeted ultrasound is performed, showing normal tissue in the area of clinical concern at 1 o'clock 4 cm from the nipple. IMPRESSION: No evidence of malignancy in the left breast. RECOMMENDATION: Bilateral screening mammogram in January of 2022 is recommended. I have discussed the findings and recommendations with the patient. If applicable, a reminder letter will be sent to the patient regarding the next appointment. BI-RADS CATEGORY  1: Negative. Electronically Signed   By: Baird Lyons M.D.   On: 01/09/2020 15:52   MM DIAG BREAST TOMO UNI LEFT  Result Date: 01/09/2020 CLINICAL DATA:  Patient complains of focal left breast pain. Patient had a benign skin punch biopsy in this area. EXAM: DIGITAL DIAGNOSTIC LEFT MAMMOGRAM WITH CAD AND TOMO ULTRASOUND LEFT BREAST COMPARISON:  Previous exam(s). ACR Breast Density Category b: There are scattered areas of fibroglandular density. FINDINGS: No suspicious mass, malignant type microcalcifications or distortion detected in the left breast. Spot tangential view of the area of clinical concern shows normal fibroglandular tissue. Mammographic images were processed with CAD. On physical exam, I do not palpate a discrete mass in left breast at 1 o'clock 4 cm from the nipple. Targeted ultrasound is performed, showing normal tissue in the area of clinical concern at 1 o'clock 4 cm from the nipple. IMPRESSION: No evidence of malignancy in the left breast. RECOMMENDATION: Bilateral screening mammogram in January of 2022 is recommended. I have discussed the findings and recommendations with the patient. If applicable, a reminder letter will be sent to the patient regarding the next appointment. BI-RADS CATEGORY   1: Negative. Electronically Signed   By: Baird Lyons M.D.   On: 01/09/2020 15:52     ASSESSMENT & PLAN:  [name] is a 69 y.o. female with:  1. Mild isolated leucopenia/Neutropenia. No issues with frequent or severe infections.  2. 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 Other workup unrevealing. 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  PLAN: -Discussed pt labwork today, 01/27/20; WBC is improved, Neutropenia is nearly resolved, blood chemistries are nml, B12 is well-replaced. -Advised pt that her neutropenia could be caused by autoimmune neutropenia or nutritional deficiencies. -Advised pt that her anti-parietal cell antibody will cause ineffective/incomplete absorption of Vitamin B12.  -Advised pt that chronic acid suppression can also contribute to issues with nutrient absorption.  -Recommend pancreatic enzyme supplements for improved digestion. -Recommend daily B-complex instead of multivitamin  -Discussed possibility of using monthly B12 injections -Recommend pt begin taking Vitamin B12 SL and reduce dose to daily -Will see back as needed   FOLLOW UP: RTC with Dr Candise Che as needed   The total time spent in the appt was 20 minutes and more than 50% was on counseling and direct patient cares.  All of the patient's questions were answered with apparent satisfaction. The patient knows to call the clinic with any problems, questions or concerns.    Wyvonnia Lora MD MS AAHIVMS [facility] Hematology/Oncology Physician [facility]  (Office):       [phone] (Work cell):  [phone] (Fax):  [phone]  01/27/2020 12:58 PM  I, Carollee Herter, am acting as a scribe for Dr. Wyvonnia Lora.   .I have reviewed the above documentation for accuracy and completeness, and I agree with the above. Johney Maine MD

## 2020-02-01 ENCOUNTER — Telehealth: Payer: Self-pay | Admitting: *Deleted

## 2020-02-01 NOTE — Telephone Encounter (Signed)
Contacted patient in response to MyChart question sent on 8/27 with Dr. Clyda Greener response: Per Dr. Candise Che - reduce B12 to 500 mcg and switch to sublingual tablet. Patient verbalized understanding of directions.

## 2020-05-17 ENCOUNTER — Other Ambulatory Visit: Payer: Self-pay | Admitting: Internal Medicine

## 2020-05-17 DIAGNOSIS — M810 Age-related osteoporosis without current pathological fracture: Secondary | ICD-10-CM

## 2020-06-06 ENCOUNTER — Ambulatory Visit: Payer: Medicare Other

## 2020-06-14 ENCOUNTER — Other Ambulatory Visit: Payer: Self-pay | Admitting: Obstetrics and Gynecology

## 2020-06-14 DIAGNOSIS — Z1231 Encounter for screening mammogram for malignant neoplasm of breast: Secondary | ICD-10-CM

## 2020-07-26 ENCOUNTER — Ambulatory Visit
Admission: RE | Admit: 2020-07-26 | Discharge: 2020-07-26 | Disposition: A | Discharge status: Home / Self Care | Payer: Medicare Other | Source: Ambulatory Visit | Attending: Obstetrics and Gynecology | Admitting: Obstetrics and Gynecology

## 2020-07-26 ENCOUNTER — Other Ambulatory Visit: Payer: Self-pay

## 2020-07-26 DIAGNOSIS — Z1231 Encounter for screening mammogram for malignant neoplasm of breast: Secondary | ICD-10-CM

## 2020-08-24 ENCOUNTER — Other Ambulatory Visit: Payer: Medicare Other

## 2020-09-18 ENCOUNTER — Other Ambulatory Visit: Payer: Self-pay

## 2020-09-18 ENCOUNTER — Ambulatory Visit
Admission: RE | Admit: 2020-09-18 | Discharge: 2020-09-18 | Disposition: A | Discharge status: Home / Self Care | Payer: Medicare Other | Source: Ambulatory Visit | Attending: Internal Medicine | Admitting: Internal Medicine

## 2020-09-18 DIAGNOSIS — M810 Age-related osteoporosis without current pathological fracture: Secondary | ICD-10-CM

## 2021-03-01 ENCOUNTER — Other Ambulatory Visit (HOSPITAL_COMMUNITY): Payer: Self-pay | Admitting: *Deleted

## 2021-03-04 ENCOUNTER — Inpatient Hospital Stay (HOSPITAL_COMMUNITY): Admission: RE | Admit: 2021-03-04 | Payer: Medicare Other | Source: Ambulatory Visit

## 2021-03-04 ENCOUNTER — Encounter (HOSPITAL_COMMUNITY): Payer: Self-pay

## 2021-03-14 ENCOUNTER — Ambulatory Visit (HOSPITAL_COMMUNITY): Payer: Medicare Other

## 2021-05-20 ENCOUNTER — Other Ambulatory Visit: Payer: Self-pay | Admitting: Obstetrics and Gynecology

## 2021-05-20 DIAGNOSIS — Z1231 Encounter for screening mammogram for malignant neoplasm of breast: Secondary | ICD-10-CM

## 2021-07-29 ENCOUNTER — Ambulatory Visit
Admission: RE | Admit: 2021-07-29 | Discharge: 2021-07-29 | Disposition: A | Discharge status: Home / Self Care | Payer: Medicare Other | Source: Ambulatory Visit | Attending: Obstetrics and Gynecology | Admitting: Obstetrics and Gynecology

## 2021-07-29 DIAGNOSIS — Z1231 Encounter for screening mammogram for malignant neoplasm of breast: Secondary | ICD-10-CM

## 2021-09-12 ENCOUNTER — Other Ambulatory Visit: Payer: Self-pay | Admitting: Internal Medicine

## 2021-09-12 DIAGNOSIS — E785 Hyperlipidemia, unspecified: Secondary | ICD-10-CM

## 2021-10-04 ENCOUNTER — Encounter (HOSPITAL_COMMUNITY): Payer: Medicare Other

## 2021-10-08 ENCOUNTER — Other Ambulatory Visit: Payer: Self-pay | Admitting: Obstetrics and Gynecology

## 2021-10-08 DIAGNOSIS — Z1231 Encounter for screening mammogram for malignant neoplasm of breast: Secondary | ICD-10-CM

## 2021-10-09 ENCOUNTER — Ambulatory Visit
Admission: RE | Admit: 2021-10-09 | Discharge: 2021-10-09 | Disposition: A | Discharge status: Home / Self Care | Payer: No Typology Code available for payment source | Source: Ambulatory Visit | Attending: Internal Medicine | Admitting: Internal Medicine

## 2021-10-09 DIAGNOSIS — E785 Hyperlipidemia, unspecified: Secondary | ICD-10-CM

## 2021-10-30 ENCOUNTER — Other Ambulatory Visit: Payer: Self-pay

## 2021-10-30 DIAGNOSIS — M810 Age-related osteoporosis without current pathological fracture: Secondary | ICD-10-CM | POA: Insufficient documentation

## 2021-10-31 ENCOUNTER — Telehealth: Payer: Self-pay | Admitting: Pharmacy Technician

## 2021-10-31 NOTE — Telephone Encounter (Addendum)
Auth Submission: no auth needed Payer: medicare  Medication & CPT/J Code(s) submitted: Reclast (Zolendronic acid) [id number] Route of submission (phone, fax, portal): phone Auth type: Buy/Bill Units/visits requested: x1 dose Reference number:[id number] Approval from: 10/31/21 to 11/01/22  Patient has not $226 deductible  F/u: patient has refused treatement due to she does not feel comfortable with side effects. Advised patient to f/u with her Dr.

## 2022-01-09 IMAGING — US US BREAST*L* LIMITED INC AXILLA
1 series · 2 of 2 positions shown · non-contrast
Comparison: Previous exam(s).

CLINICAL DATA: Patient complains of focal left breast pain. Patient
had a benign skin punch biopsy in this area.

EXAM:
DIGITAL DIAGNOSTIC LEFT MAMMOGRAM WITH CAD AND TOMO
ULTRASOUND LEFT BREAST

[Series 1: us breast*left* limited inc axilla · 0.06mm/px · 2 of 2 slices shown]
[im 1/2]
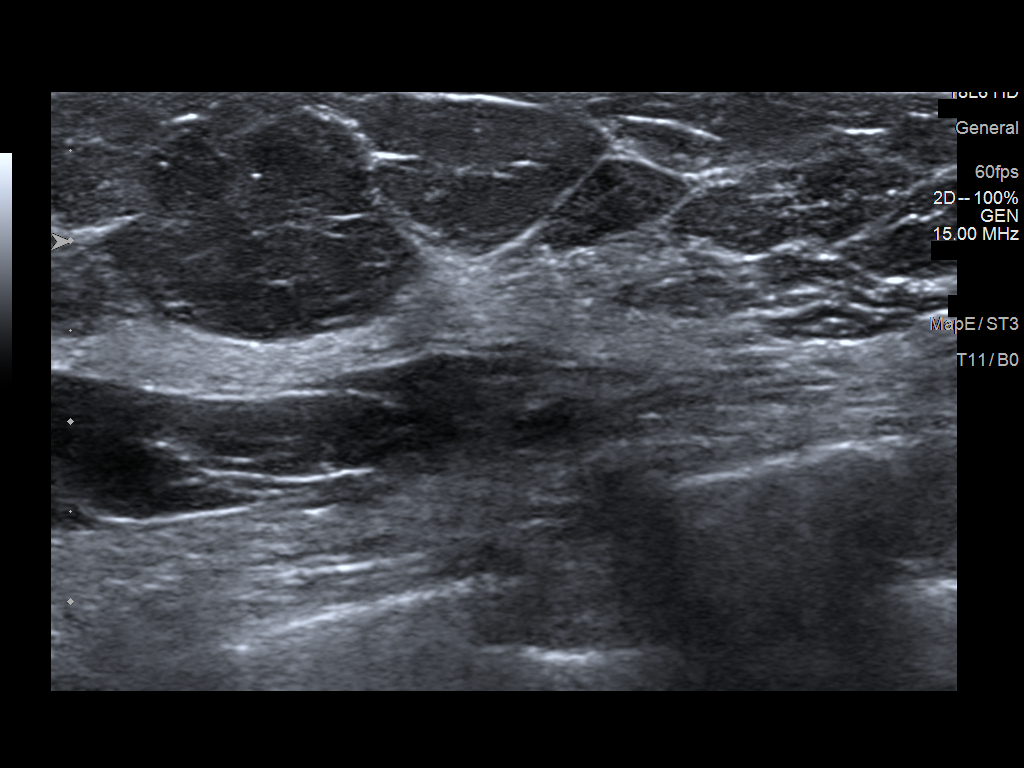
[im 2/2]
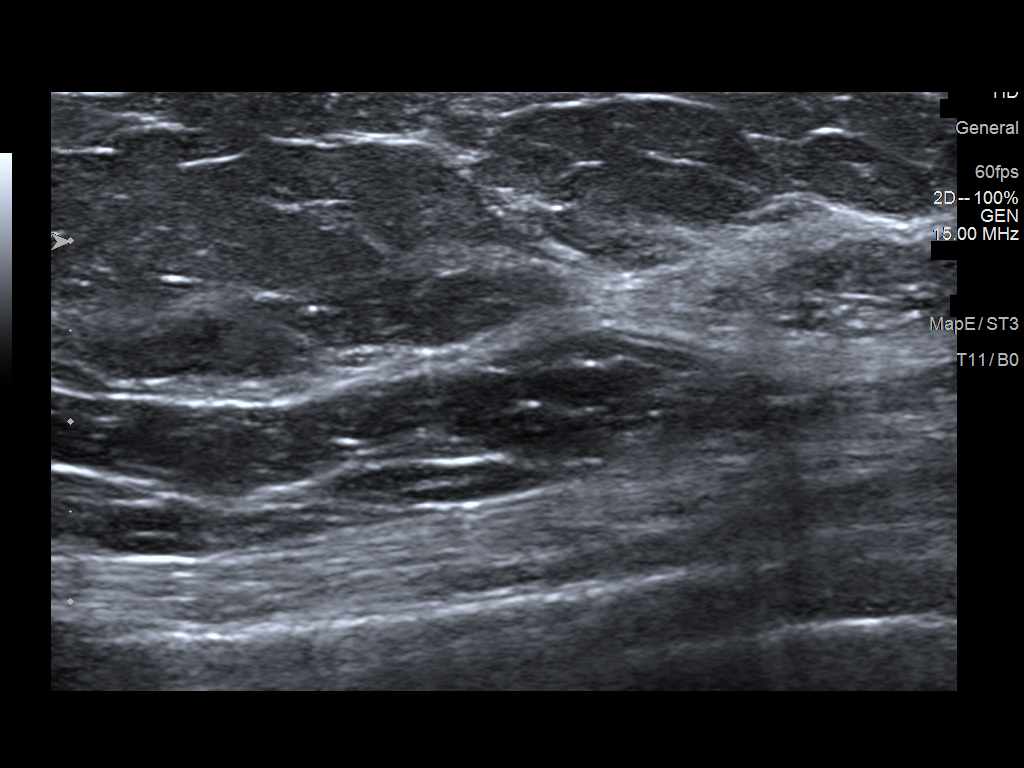

[2 of 2 positions shown; findings below may reference images not displayed]

ACR Breast Density Category b: There are scattered areas of
fibroglandular density.
FINDINGS: No suspicious mass, malignant type microcalcifications or distortion
detected in the left breast. Spot tangential view of the area of
clinical concern shows normal fibroglandular tissue.

Mammographic images were processed with CAD.

On physical exam, I do not palpate a discrete mass in left breast at
1 o'clock 4 cm from the nipple.

Targeted ultrasound is performed, showing normal tissue in the area
of clinical concern at 1 o'clock 4 cm from the nipple.
IMPRESSION: No evidence of malignancy in the left breast.

RECOMMENDATION:
Bilateral screening mammogram in Tuesday June, 2020 is recommended.

I have discussed the findings and recommendations with the patient.
If applicable, a reminder letter will be sent to the patient
regarding the next appointment.

BI-RADS CATEGORY  1: Negative.

## 2022-01-09 IMAGING — MG MM DIGITAL DIAGNOSTIC UNILAT*L* W/ TOMO W/ CAD
6 series · 6 of 18 positions shown · non-contrast
Comparison: Previous exam(s).

CLINICAL DATA: Patient complains of focal left breast pain. Patient
had a benign skin punch biopsy in this area.

EXAM:
DIGITAL DIAGNOSTIC LEFT MAMMOGRAM WITH CAD AND TOMO
ULTRASOUND LEFT BREAST

[L MLO synth-2D (1 of 2)]
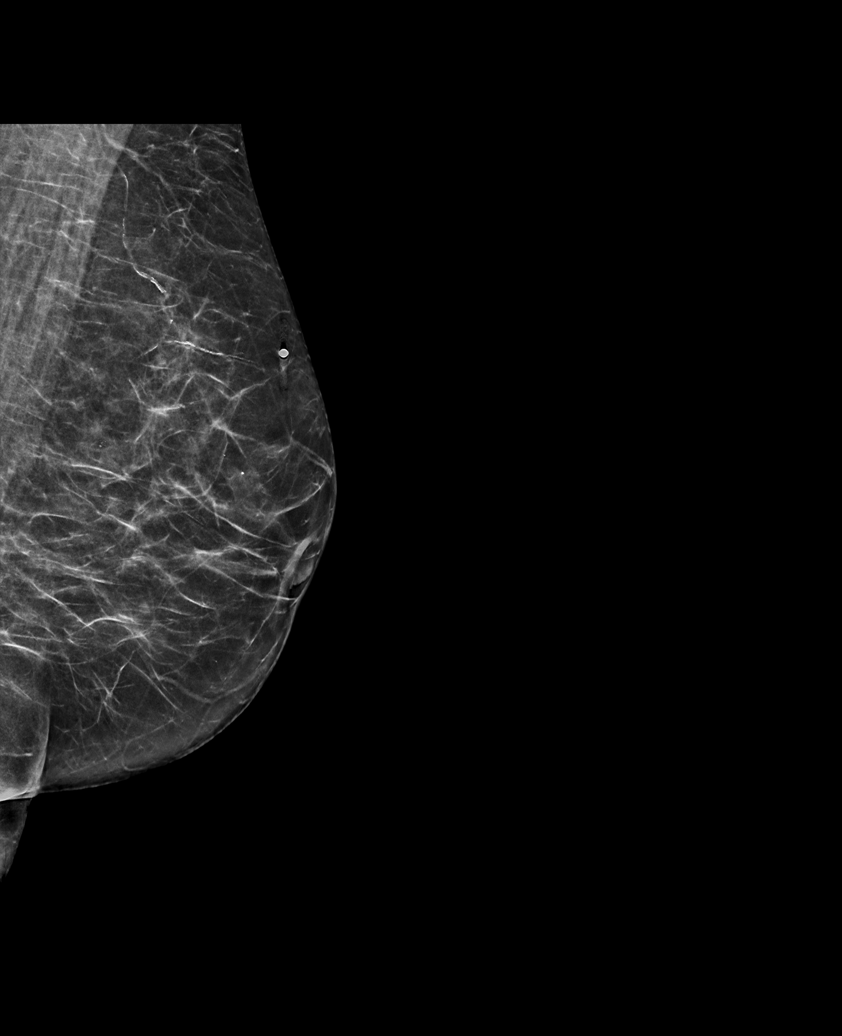

[L CC synth-2D]
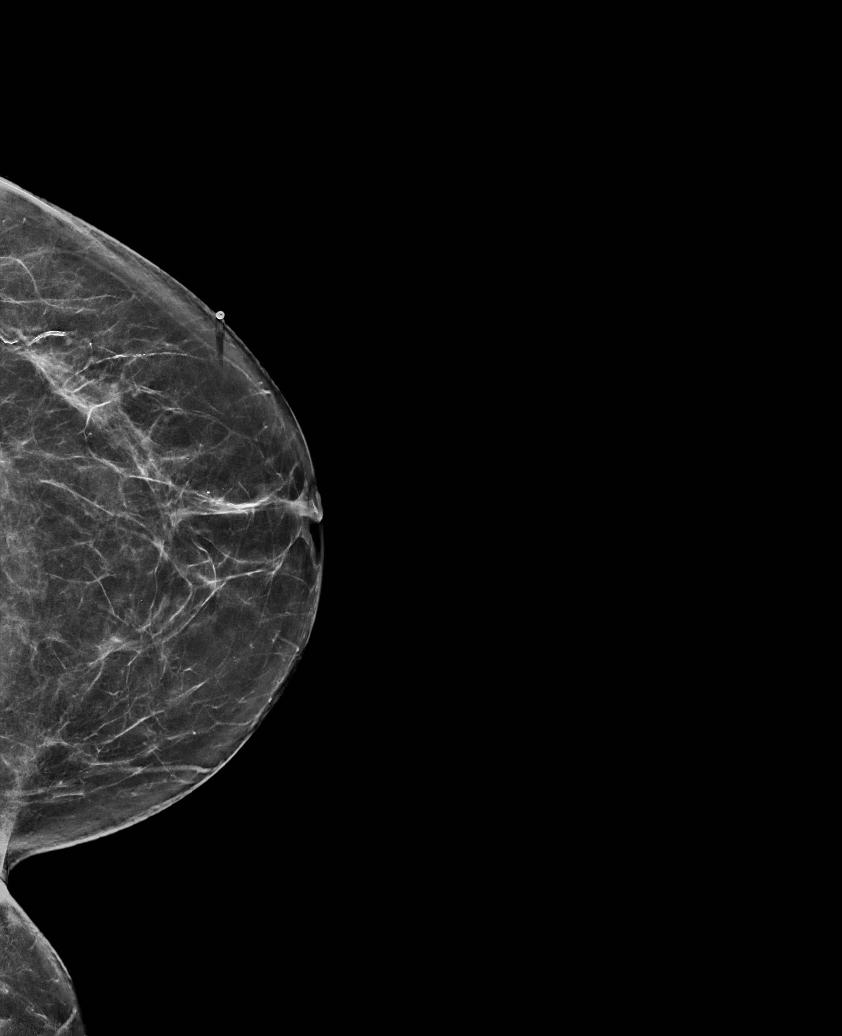

[L MLO synth-2D (2 of 2)]
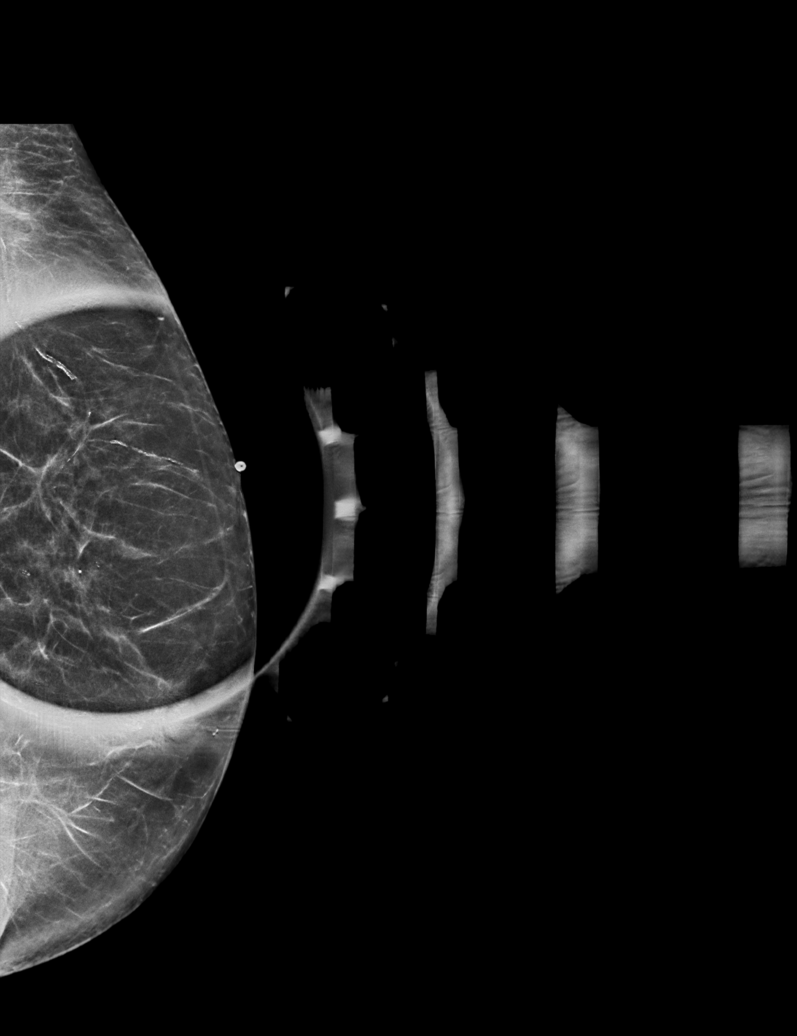

[L CC tomo · tomo slice 35/68.0]
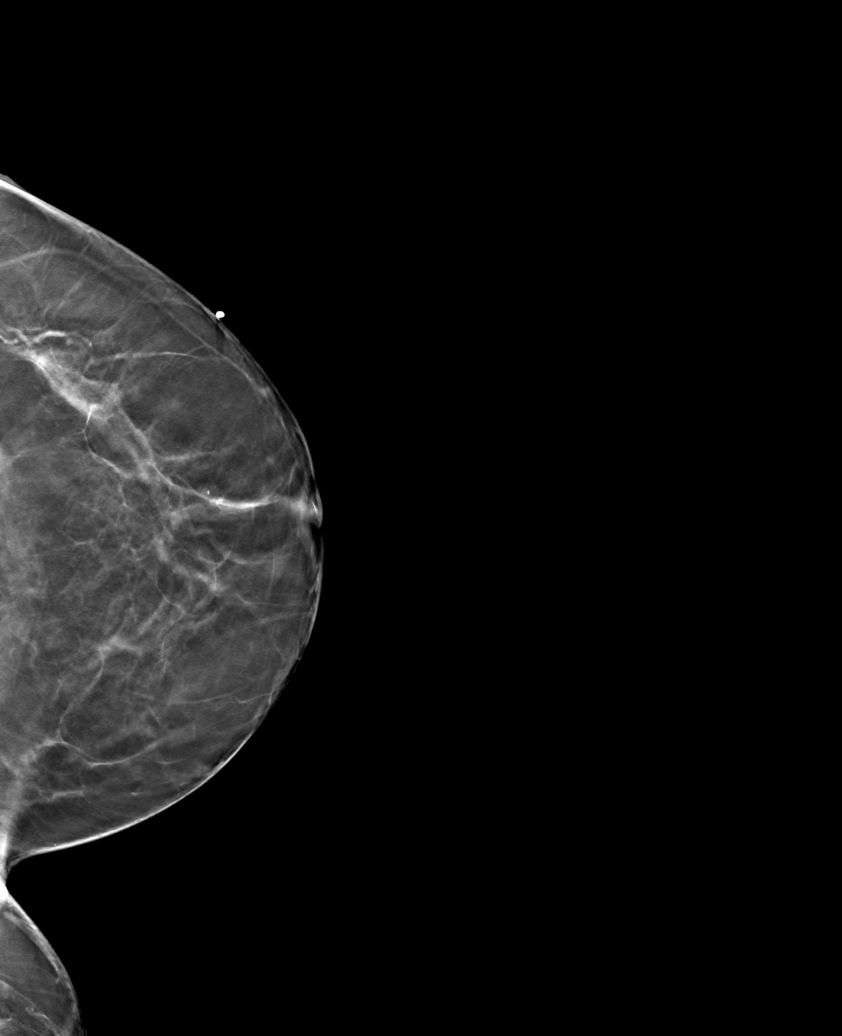

[L MLO tomo (1 of 2) · tomo slice 33/64.0]
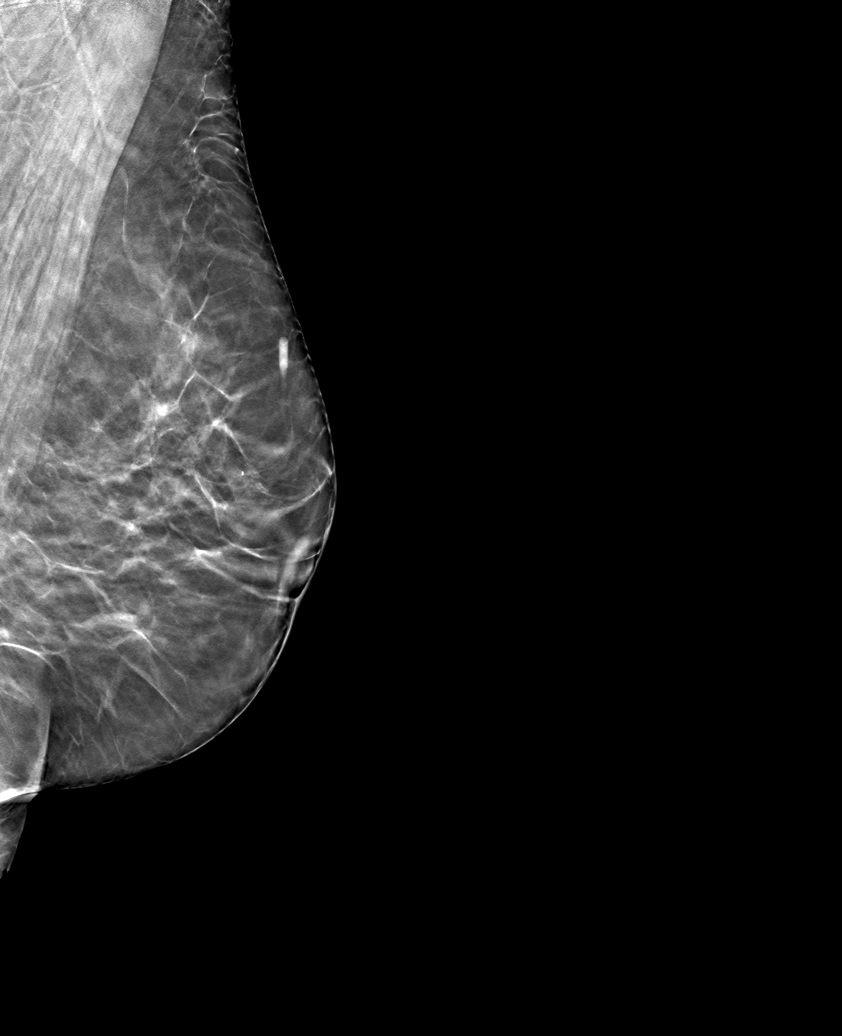

[L MLO tomo (2 of 2) · tomo slice 29/57.0]
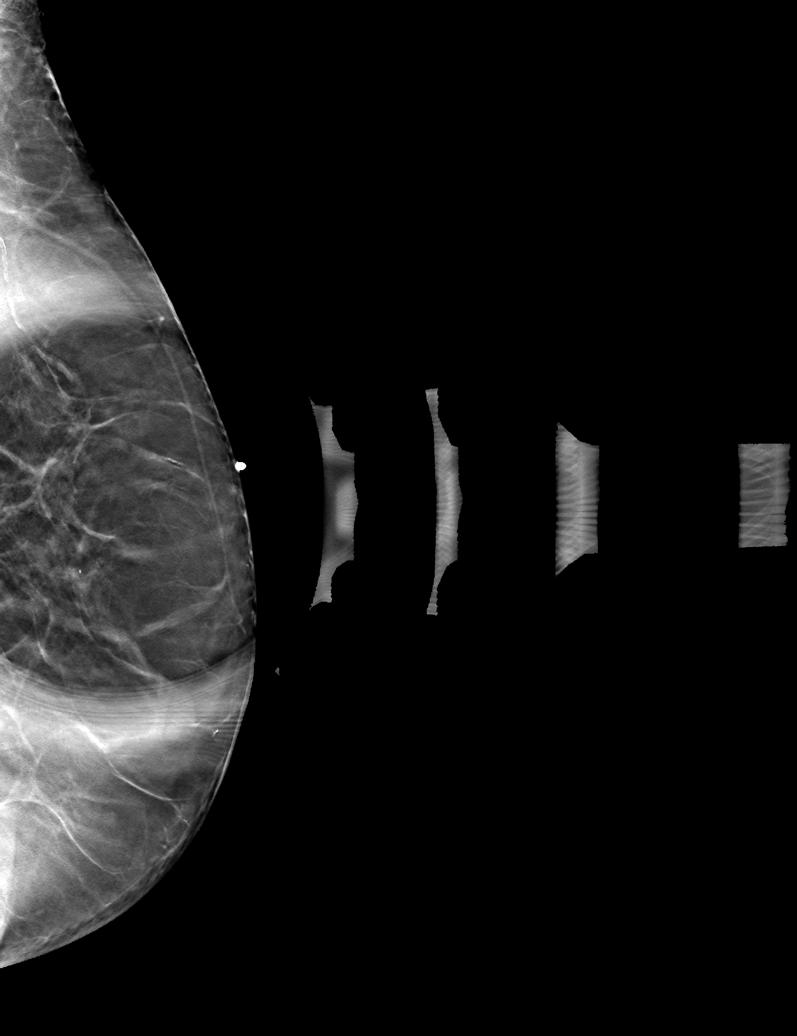

[6 of 18 positions shown; findings below may reference images not displayed]

ACR Breast Density Category b: There are scattered areas of
fibroglandular density.
FINDINGS: No suspicious mass, malignant type microcalcifications or distortion
detected in the left breast. Spot tangential view of the area of
clinical concern shows normal fibroglandular tissue.

Mammographic images were processed with CAD.

On physical exam, I do not palpate a discrete mass in left breast at
1 o'clock 4 cm from the nipple.

Targeted ultrasound is performed, showing normal tissue in the area
of clinical concern at 1 o'clock 4 cm from the nipple.
IMPRESSION: No evidence of malignancy in the left breast.

RECOMMENDATION:
Bilateral screening mammogram in Tuesday June, 2020 is recommended.

I have discussed the findings and recommendations with the patient.
If applicable, a reminder letter will be sent to the patient
regarding the next appointment.

BI-RADS CATEGORY  1: Negative.

## 2022-03-27 NOTE — Progress Notes (Signed)
Cardiology Office Note   Date:  03/28/2022   ID:  [name], DOB May 15, 1951, MRN [id number]  PCP:  Crist Infante, MD  Cardiologist:   Minus Breeding, MD Referring:  Crist Infante, MD  Chief Complaint  Patient presents with   Palpitations      History of Present Illness: [name] is a 71 y.o. female who presents for evaluation of palpitations.    Coronary calcium score was zero in 2020.     I saw her in 2016.   She had a negative POET (Plain Old Exercise Treadmill) at that time.    She says that she has been getting palpitations's and wonders if it could be related to the stress of her 46 year old daughter living with her.   She feels her heart skipping.  She is not describing sustained tachypalpitations.  Rather it is more of a "flip-flop".  She has not had any presyncope or syncope with this.  She occasionally has some dizziness.  It happens sometimes at night when she is trying to lie particular on her left side.  She went to the beach recently and was relaxing with friends and actually had no symptoms.  She is not having any chest pressure, neck or arm discomfort.  She has not been as physically active because she has had some arthritis.  She was exercising a year ago.  She had a little balance issue.  Coincidently yesterday she noticed  the left little finger was ecchymotic and swollen.  She had no trauma to it.  Its not sore.  She has no loss of mobility.  She has had no other systemic symptoms.   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   Depression    GERD (gastroesophageal reflux disease)    High cholesterol    History of anemia    Hypothyroidism    Osteoarthritis     Past Surgical History:  Procedure Laterality Date   CARPAL TUNNEL RELEASE  02/03/2012   Procedure: CARPAL TUNNEL RELEASE;  Surgeon: Cammie Sickle., MD;  Location: [place];  Service:  Orthopedics;  Laterality: Left;   CARPAL TUNNEL RELEASE  04/20/2012   Procedure: CARPAL TUNNEL RELEASE;  Surgeon: Cammie Sickle., MD;  Location: [place];  Service: Orthopedics;  Laterality: Right;  RIGHT CARPAL TUNNEL RELEASE    COMBINED HYSTEROSCOPY DIAGNOSTIC / D&C  06/29/2001   with resectoscopic polypectomy   I & D EXTREMITY Bilateral 09/23/2016   Procedure: IRRIGATION AND DEBRIDEMENT BILATERAL ARMS;  Surgeon: Roseanne Kaufman, MD;  Location: [place];  Service: Orthopedics;  Laterality: Bilateral;   TRIGGER FINGER RELEASE  04/20/2012   Procedure: RELEASE TRIGGER FINGER/A-1 PULLEY;  Surgeon: Cammie Sickle., MD;  Location: [place];  Service: Orthopedics;  Laterality: Right;  RIGHT LONG A-1 RELEASE, RIGHT RING A-1 PULLEY RELEASE      Current Outpatient Medications  Medication Sig Dispense Refill   ALPRAZolam (XANAX) 0.25 MG tablet Take 0.25 tablets by mouth as needed.  2   Ascorbic Acid (VITAMIN C) 1000 MG tablet Take 2,000 mg by mouth 2 (two) times daily.     atorvastatin (LIPITOR) 40 MG tablet Take 40 mg by mouth daily.     butalbital-aspirin-caffeine (FIORINAL) 50-325-40 MG capsule Take 50 capsules by mouth as needed.  0   calcium citrate-vitamin D 200-200 MG-UNIT TABS Take  1 tablet by mouth daily.     cetirizine (ZYRTEC) 10 MG tablet Take 10 mg by mouth daily.     cholecalciferol (VITAMIN D) 1000 units tablet Take 1,000 Units by mouth 2 (two) times daily.     Co-Enzyme Q-10 30 MG CAPS Take 200 mg by mouth daily.     dexlansoprazole (DEXILANT) 60 MG capsule Take 60 mg by mouth daily.     EPINEPHrine 0.3 mg/0.3 mL IJ SOAJ injection epinephrine 0.3 mg/0.3 mL injection, auto-injector  USE AS NEEDED AND AS DIRECTED FOR SEVERE ALLERGIC REACTION     escitalopram (LEXAPRO) 10 MG tablet Take 10 mg by mouth daily.     famotidine (PEPCID) 40 MG tablet TAKE 1 TABLET BY MOUTH EVERY DAY IN THE MORNING     ferrous sulfate 325 (65 FE) MG tablet Take 325 mg by  mouth daily with breakfast.     levothyroxine (SYNTHROID) 75 MCG tablet Take 75 mcg by mouth daily.     Multiple Vitamins-Minerals (MULTIVITAMIN ADULT PO) Take by mouth daily.     No current facility-administered medications for this visit.    Allergies:   Penicillins and Alpha-gal    Social History:  The patient  reports that she has quit smoking. She has never used smokeless tobacco. She reports that she does not drink alcohol and does not use drugs.   Family History:  The patient's family history includes Alcoholism in her mother; Alzheimer's disease in her father.    ROS:  Please see the history of present illness.   Otherwise, review of systems are positive for none.   All other systems are reviewed and negative.    PHYSICAL EXAM: VS:  BP 110/72   Pulse 85   Ht 5\' 2"  (1.575 m)   Wt 115 lb 12.8 oz (52.5 kg)   SpO2 97%   BMI 21.18 kg/m  , BMI Body mass index is 21.18 kg/m. GENERAL:  Well appearing HEENT:  Pupils equal round and reactive, fundi not visualized, oral mucosa unremarkable NECK:  No jugular venous distention, waveform within normal limits, carotid upstroke brisk and symmetric, no bruits, no thyromegaly LYMPHATICS:  No cervical, inguinal adenopathy LUNGS:  Clear to auscultation bilaterally BACK:  No CVA tenderness CHEST:  Unremarkable HEART:  PMI not displaced or sustained,S1 and S2 within normal limits, no S3, no S4, no clicks, no rubs, 2 out of 6 apical systolic murmur heard only at the apex and radiating slightly to the axilla, no diastolic murmurs ABD:  Flat, positive bowel sounds normal in frequency in pitch, no bruits, no rebound, no guarding, no midline pulsatile mass, no hepatomegaly, no splenomegaly EXT:  2 plus pulses throughout, no edema, no cyanosis no clubbing SKIN:  No rashes no nodules NEURO:  Cranial nerves II through XII grossly intact, motor grossly intact throughout PSYCH:  Cognitively intact, oriented to person place and time    EKG:  EKG is  ordered today. The ekg ordered today demonstrates sinus rhythm, premature atrial contractions, rate 85, intervals within normal limits, axis within normal limits, no acute ST-T wave changes.     Recent Labs: No results found for requested labs within last 365 days.    Lipid Panel    Component Value Date/Time   CHOL 252 (H) 08/21/2010 0917   TRIG 89.0 08/21/2010 0917   HDL 100.10 08/21/2010 0917   CHOLHDL 3 08/21/2010 0917   VLDL 17.8 08/21/2010 0917   LDLDIRECT 127.0 08/21/2010 0917      Wt Readings from  Last 3 Encounters:  03/28/22 115 lb 12.8 oz (52.5 kg)  01/27/20 122 lb (55.3 kg)  05/05/19 127 lb 12.8 oz (58 kg)      Other studies Reviewed: Additional studies/ records that were reviewed today include: Labs. Review of the above records demonstrates:  Please see elsewhere in the note.     ASSESSMENT AND PLAN:  Palpitations: She is having premature contractions.  I am going to have her wear a 2-week monitor.  To exclude any rhythm such as atrial fibrillation.  However, if these are really premature ectopic beats that she would prefer to manage this conservatively.  Do note that electrolytes were normal recently and earlier this year her TSH was normal.  Murmur: She has a slight apical and axillary murmur.  Given this and the arrhythmia as well as a discoloration of her fingers I will check an echocardiogram.  Ecchymotic left middle finger: Does not appear to be trauma.  It could have been a ruptured vessel.  It is only minimally swollen and not uncomfortable.  Pulses are normal.  I will be doing work-up as above.  If however this gets worse she will either call me back or contact Rodrigo Ran, MD   Current medicines are reviewed at length with the patient today.  The patient does not have concerns regarding medicines.  The following changes have been made:  no change  Labs/ tests ordered today include: None  Orders Placed This Encounter  Procedures   LONG TERM  MONITOR (3-14 DAYS)   EKG 12-Lead   ECHOCARDIOGRAM LIMITED     Disposition:   FU with me as needed based on the results of the above.     Signed, Rollene Rotunda, MD  03/28/2022 9:37 AM    [facility]

## 2022-03-28 ENCOUNTER — Ambulatory Visit (INDEPENDENT_AMBULATORY_CARE_PROVIDER_SITE_OTHER): Payer: Medicare Other

## 2022-03-28 ENCOUNTER — Encounter: Payer: Self-pay | Admitting: Cardiology

## 2022-03-28 ENCOUNTER — Ambulatory Visit (HOSPITAL_BASED_OUTPATIENT_CLINIC_OR_DEPARTMENT_OTHER): Payer: Medicare Other

## 2022-03-28 ENCOUNTER — Ambulatory Visit: Payer: Medicare Other | Attending: Cardiology | Admitting: Cardiology

## 2022-03-28 VITALS — BP 110/72 | HR 85 | Ht 62.0 in | Wt 115.8 lb

## 2022-03-28 DIAGNOSIS — R011 Cardiac murmur, unspecified: Secondary | ICD-10-CM | POA: Diagnosis present

## 2022-03-28 DIAGNOSIS — R002 Palpitations: Secondary | ICD-10-CM | POA: Insufficient documentation

## 2022-03-28 LAB — ECHOCARDIOGRAM COMPLETE
AV Vena cont: 0.2 cm
Area-P 1/2: 3.99 cm2
Height: 62 in
P 1/2 time: 479 msec
S' Lateral: 2.7 cm
Weight: 1852.8 oz

## 2022-03-28 NOTE — Patient Instructions (Addendum)
Medication Instructions:    No changes    Lab Work:  Not needed   Testing/Procedures:  Will be schedule at [address].  Your physician has requested that you have an echocardiogram. Echocardiography is a painless test that uses sound waves to create images of your heart. It provides your doctor with information about the size and shape of your heart and how well your heart's chambers and valves are working. This procedure takes approximately one hour. There are no restrictions for this procedure. Please do NOT wear cologne, perfume, aftershave, or lotions (deodorant is allowed). Please arrive 15 minutes prior to your appointment time.  Will be mailed to you in 3 to 5 days Your physician has recommended that you wear a holter monitor 14 day Zio . Holter monitors are medical devices that record the heart's electrical activity. Doctors most often use these monitors to diagnose arrhythmias. Arrhythmias are problems with the speed or rhythm of the heartbeat. The monitor is a small, portable device. You can wear one while you do your normal daily activities. This is usually used to diagnose what is causing palpitations/syncope (passing out).   Follow-Up: At [facility], you and your health needs are our priority.  As part of our continuing mission to provide you with exceptional heart care, we have created designated Provider Care Teams.  These Care Teams include your primary Cardiologist (physician) and Advanced Practice Providers (APPs -  Physician Assistants and Nurse Practitioners) who all work together to provide you with the care you need, when you need it.     Your next appointment:   6 week(s)  The format for your next appointment:   In Person  Provider:   Rollene Rotunda, MD    Other Instructions  ZIO XT- Long Term Monitor Instructions  Your physician has requested you wear a ZIO patch monitor for 14 days.  This is a single patch monitor. [employer]  supplies one patch monitor per enrollment. Additional stickers are not available. Please do not apply patch if you will be having a Nuclear Stress Test,  Echocardiogram, Cardiac CT, MRI, or Chest Xray during the period you would be wearing the  monitor. The patch cannot be worn during these tests. You cannot remove and re-apply the  ZIO XT patch monitor.  Your ZIO patch monitor will be mailed 3 day USPS to your address on file. It may take 3-5 days  to receive your monitor after you have been enrolled.  Once you have received your monitor, please review the enclosed instructions. Your monitor  has already been registered assigning a specific monitor serial # to you.  Billing and Patient Assistance Program Information  We have supplied [employer] with any of your insurance information on file for billing purposes. [employer] offers a sliding scale Patient Assistance Program for patients that do not have  insurance, or whose insurance does not completely cover the cost of the ZIO monitor.  You must apply for the Patient Assistance Program to qualify for this discounted rate.  To apply, please call [employer] at [phone], select option 4, select option 2, ask to apply for  Patient Assistance Program. Meredeth Ide will ask your household income, and how many people  are in your household. They will quote your out-of-pocket cost based on that information.  [employer] will also be able to set up a 100-month, interest-free payment plan if needed.  Applying the monitor   Shave hair from upper left chest.  Hold abrader disc  by orange tab. Rub abrader in 40 strokes over the upper left chest as  indicated in your monitor instructions.  Clean area with 4 enclosed alcohol pads. Let dry.  Apply patch as indicated in monitor instructions. Patch will be placed under collarbone on left  side of chest with arrow pointing upward.  Rub patch adhesive wings for 2 minutes. Remove white label marked "1". Remove the white   label marked "2". Rub patch adhesive wings for 2 additional minutes.  While looking in a mirror, press and release button in center of patch. A small green light will  flash 3-4 times. This will be your only indicator that the monitor has been turned on.  Do not shower for the first 24 hours. You may shower after the first 24 hours.  Press the button if you feel a symptom. You will hear a small click. Record Date, Time and  Symptom in the Patient Logbook.  When you are ready to remove the patch, follow instructions on the last 2 pages of Patient  Logbook. Stick patch monitor onto the last page of Patient Logbook.  Place Patient Logbook in the blue and white box. Use locking tab on box and tape box closed  securely. The blue and white box has prepaid postage on it. Please place it in the mailbox as  soon as possible. Your physician should have your test results approximately 7 days after the  monitor has been mailed back to [facility].  Call [facility] at [phone] if you have questions regarding  your ZIO XT patch monitor. Call them immediately if you see an orange light blinking on your  monitor.  If your monitor falls off in less than 4 days, contact our Monitor department at [phone].  If your monitor becomes loose or falls off after 4 days call [employer] at [phone] for  suggestions on securing your monitor

## 2022-03-28 NOTE — Progress Notes (Unsigned)
Enrolled patient for a 14 day Zio XT  monitor to be mailed to patients home  °

## 2022-03-31 DIAGNOSIS — R002 Palpitations: Secondary | ICD-10-CM | POA: Diagnosis not present

## 2022-04-20 NOTE — Progress Notes (Unsigned)
Cardiology Office Note   Date:  04/21/2022   ID:  Loyce, Flaming 1951-04-13, MRN [id number]  PCP:  Rodrigo Ran, MD  Cardiologist:   Rollene Rotunda, MD Referring:  Rodrigo Ran, MD  Chief Complaint  Patient presents with   Palpitations      History of Present Illness: [name] is a 71 y.o. female who presents for evaluation of palpitations.    Coronary calcium score was zero in 2020.     I saw her in 2016.   She had a negative POET (Plain Old Exercise Treadmill) at that time.    I saw her last month and she had calcium score of zero.  She had an bruised appearing finger at the last visit.  I sent her for an echo.  There were no abnormalities and no  evidence of embolic source.  Monitor was worn for 2 weeks but has just been turned in.  She has been feeling palpitations.  She has not any presyncope or syncope.  She had resolution of the ecchymotic finge but has had some mild swelling.  She has not had any new palpitations, presyncope or syncope.  She had no new chest pressure, neck or arm discomfort.  She has had no weight gain or edema.  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   Depression    GERD (gastroesophageal reflux disease)    High cholesterol    History of anemia    Hypothyroidism    Osteoarthritis     Past Surgical History:  Procedure Laterality Date   CARPAL TUNNEL RELEASE  02/03/2012   Procedure: CARPAL TUNNEL RELEASE;  Surgeon: Wyn Forster., MD;  Location: [facility];  Service: Orthopedics;  Laterality: Left;   CARPAL TUNNEL RELEASE  04/20/2012   Procedure: CARPAL TUNNEL RELEASE;  Surgeon: Wyn Forster., MD;  Location: [facility];  Service: Orthopedics;  Laterality: Right;  RIGHT CARPAL TUNNEL RELEASE    COMBINED HYSTEROSCOPY DIAGNOSTIC / D&C  06/29/2001   with resectoscopic polypectomy   I & D EXTREMITY Bilateral  09/23/2016   Procedure: IRRIGATION AND DEBRIDEMENT BILATERAL ARMS;  Surgeon: Dominica Severin, MD;  Location[facility];  Service: Orthopedics;  Laterality: Bilateral;   TRIGGER FINGER RELEASE  04/20/2012   Procedure: RELEASE TRIGGER FINGER/A-1 PULLEY;  Surgeon: Wyn Forster., MD;  Location: [facility];  Service: Orthopedics;  Laterality: Right;  RIGHT LONG A-1 RELEASE, RIGHT RING A-1 PULLEY RELEASE      Current Outpatient Medications  Medication Sig Dispense Refill   ALPRAZolam (XANAX) 0.25 MG tablet Take 0.25 tablets by mouth as needed.  2   Ascorbic Acid (VITAMIN C) 1000 MG tablet Take 2,000 mg by mouth 2 (two) times daily.     atorvastatin (LIPITOR) 40 MG tablet Take 40 mg by mouth daily.     butalbital-aspirin-caffeine (FIORINAL) 50-325-40 MG capsule Take 50 capsules by mouth as needed.  0   calcium citrate-vitamin D 200-200 MG-UNIT TABS Take 1 tablet by mouth daily.     cholecalciferol (VITAMIN D) 1000 units tablet Take 1,000 Units by mouth 2 (two) times daily.     Co-Enzyme Q-10 30 MG CAPS Take 200 mg by mouth daily.     EPINEPHrine 0.3 mg/0.3 mL IJ SOAJ injection epinephrine 0.3 mg/0.3 mL injection, auto-injector  USE AS NEEDED AND AS DIRECTED FOR SEVERE ALLERGIC REACTION  escitalopram (LEXAPRO) 10 MG tablet Take 10 mg by mouth daily.     famotidine (PEPCID) 40 MG tablet TAKE 1 TABLET BY MOUTH EVERY DAY IN THE MORNING     ferrous sulfate 325 (65 FE) MG tablet Take 325 mg by mouth daily with breakfast.     levothyroxine (SYNTHROID) 75 MCG tablet Take 75 mcg by mouth daily.     Multiple Vitamins-Minerals (MULTIVITAMIN ADULT PO) Take by mouth daily.     No current facility-administered medications for this visit.    Allergies:   Penicillins and Alpha-gal    ROS:  Please see the history of present illness.   Otherwise, review of systems are positive for none.   All other systems are reviewed and negative.    PHYSICAL EXAM: VS:  BP 104/68 (BP Location: Left Arm,  Patient Position: Sitting)   Pulse 75   Ht 5\' 2"  (1.575 m)   Wt 120 lb 12.8 oz (54.8 kg)   SpO2 98%   BMI 22.09 kg/m  , BMI Body mass index is 22.09 kg/m. GENERAL:  Well appearing NECK:  No jugular venous distention, waveform within normal limits, carotid upstroke brisk and symmetric, no bruits, no thyromegaly LUNGS:  Clear to auscultation bilaterally CHEST:  Unremarkable HEART:  PMI not displaced or sustained,S1 and S2 within normal limits, no S3, no S4, no clicks, no rubs, no murmurs ABD:  Flat, positive bowel sounds normal in frequency in pitch, no bruits, no rebound, no guarding, no midline pulsatile mass, no hepatomegaly, no splenomegaly EXT:  2 plus pulses throughout, no edema, no cyanosis no clubbing   EKG:  EKG is not ordered today.    Recent Labs: No results found for requested labs within last 365 days.    Lipid Panel    Component Value Date/Time   CHOL 252 (H) 08/21/2010 0917   TRIG 89.0 08/21/2010 0917   HDL 100.10 08/21/2010 0917   CHOLHDL 3 08/21/2010 0917   VLDL 17.8 08/21/2010 0917   LDLDIRECT 127.0 08/21/2010 0917      Wt Readings from Last 3 Encounters:  04/21/22 120 lb 12.8 oz (54.8 kg)  03/28/22 115 lb 12.8 oz (52.5 kg)  01/27/20 122 lb (55.3 kg)      Other studies Reviewed: Additional studies/ records that were reviewed today include: None. Review of the above records demonstrates:  Please see elsewhere in the note.     ASSESSMENT AND PLAN:   Palpitations:    Monitor results are pending.  No change in therapy at this point.  She would prefer conservative therapy pending these results.  Murmur:  There were no abnormalities on echo.  There might have been some slight thickening of the aortic valve.  No change in therapy.  Ecchymotic left middle finger:    She is going to see rheumatology.  I will be looking for atrial fibrillation on the monitor but I do not suspect that there was a cardiac etiology to this event.  Preop: The patient is at  acceptable risk for planned endoscopy.  No change in therapy.   Current medicines are reviewed at length with the patient today.  The patient does not have concerns regarding medicines.  The following changes have been made:  None  Labs/ tests ordered today include:   None    No orders of the defined types were placed in this encounter.   Disposition:   Follow up with me in 12 months.    Signed, 01/29/20, MD  04/21/2022 9:45  AM    [facility]

## 2022-04-21 ENCOUNTER — Encounter: Payer: Self-pay | Admitting: Cardiology

## 2022-04-21 ENCOUNTER — Ambulatory Visit: Payer: Medicare Other | Attending: Cardiology | Admitting: Cardiology

## 2022-04-21 VITALS — BP 104/68 | HR 75 | Ht 62.0 in | Wt 120.8 lb

## 2022-04-21 DIAGNOSIS — R002 Palpitations: Secondary | ICD-10-CM | POA: Diagnosis not present

## 2022-04-21 DIAGNOSIS — R011 Cardiac murmur, unspecified: Secondary | ICD-10-CM | POA: Insufficient documentation

## 2022-04-21 NOTE — Patient Instructions (Signed)
Medication Instructions:  Your physician recommends that you continue on your current medications as directed. Please refer to the Current Medication list given to you today.  *If you need a refill on your cardiac medications before your next appointment, please call your pharmacy*  Follow-Up: At [facility], you and your health needs are our priority.  As part of our continuing mission to provide you with exceptional heart care, we have created designated Provider Care Teams.  These Care Teams include your primary Cardiologist (physician) and Advanced Practice Providers (APPs -  Physician Assistants and Nurse Practitioners) who all work together to provide you with the care you need, when you need it.  We recommend signing up for the patient portal called "MyChart".  Sign up information is provided on this After Visit Summary.  MyChart is used to connect with patients for Virtual Visits (Telemedicine).  Patients are able to view lab/test results, encounter notes, upcoming appointments, etc.  Non-urgent messages can be sent to your provider as well.   To learn more about what you can do with MyChart, go to [email or web address].    Your next appointment:   12 month(s)  The format for your next appointment:   In Person  Provider:   James Hochrein, MD      

## 2022-04-29 ENCOUNTER — Telehealth: Payer: Self-pay | Admitting: Cardiology

## 2022-04-29 NOTE — Telephone Encounter (Signed)
Patient is calling requesting her heart monitor results. Please advise.  

## 2022-04-29 NOTE — Telephone Encounter (Signed)
Spoke with pt regarding heart monitor results. Let the pt know that Dr. Antoine Poche has not had an opportunity to review these results and make recommendations. Advised her that once this has been done we can call her back with that information. Pt is wanting results before Thursday when she is having and endoscopy procedure. Will forward to Dr. Antoine Poche and primary nurse. Pt verbalizes understanding.

## 2022-04-30 NOTE — Telephone Encounter (Signed)
Spoke with patient regarding the following results. Patient made aware and patient verbalized understanding.   Rollene Rotunda, MD  Freddi Starr, RN I am sorry for the delay.  I thought I did result this sooner.  There were no significant arrhythmias.  She has preferred conservative management so I would not suggest any any medicines.  She did have some runs of supraventricular tachycardia lasting up to 14 beats but these are not needing treatment unless she wants something symptomatically.     Advised patient to call back to office with any issues, questions, or concerns. Patient verbalized understanding.

## 2022-04-30 NOTE — Telephone Encounter (Signed)
Pt calling for monitor results. She is a little upset because she has been trying to get these results, she would really appreciate a call today to review. Pt has an endoscopy tomorrow morning and needs the results before then or she will have to r/s

## 2022-04-30 NOTE — Telephone Encounter (Signed)
Patient is following up, again requesting to discuss monitor results.

## 2022-07-25 ENCOUNTER — Encounter: Payer: Self-pay | Admitting: Pulmonary Disease

## 2022-07-31 ENCOUNTER — Ambulatory Visit: Payer: Medicare Other

## 2022-08-01 ENCOUNTER — Encounter: Payer: Self-pay | Admitting: Pulmonary Disease

## 2022-08-01 ENCOUNTER — Ambulatory Visit
Admission: RE | Admit: 2022-08-01 | Discharge: 2022-08-01 | Disposition: A | Discharge status: Home / Self Care | Payer: Medicare Other | Source: Ambulatory Visit | Attending: Obstetrics and Gynecology | Admitting: Obstetrics and Gynecology

## 2022-08-01 DIAGNOSIS — Z1231 Encounter for screening mammogram for malignant neoplasm of breast: Secondary | ICD-10-CM

## 2022-11-04 ENCOUNTER — Other Ambulatory Visit: Payer: Self-pay | Admitting: *Deleted

## 2022-11-04 DIAGNOSIS — D72819 Decreased white blood cell count, unspecified: Secondary | ICD-10-CM

## 2022-11-10 ENCOUNTER — Other Ambulatory Visit: Payer: Self-pay | Admitting: *Deleted

## 2022-11-10 ENCOUNTER — Inpatient Hospital Stay: Payer: Medicare Other

## 2022-11-10 ENCOUNTER — Inpatient Hospital Stay: Payer: Medicare Other | Attending: Oncology | Admitting: Oncology

## 2022-11-10 VITALS — BP 127/80 | HR 74 | Temp 98.1°F | Resp 18 | Ht 62.0 in | Wt 117.3 lb

## 2022-11-10 DIAGNOSIS — D709 Neutropenia, unspecified: Secondary | ICD-10-CM | POA: Diagnosis present

## 2022-11-10 DIAGNOSIS — Z79899 Other long term (current) drug therapy: Secondary | ICD-10-CM | POA: Insufficient documentation

## 2022-11-10 DIAGNOSIS — E063 Autoimmune thyroiditis: Secondary | ICD-10-CM | POA: Diagnosis not present

## 2022-11-10 DIAGNOSIS — D508 Other iron deficiency anemias: Secondary | ICD-10-CM

## 2022-11-10 DIAGNOSIS — D72819 Decreased white blood cell count, unspecified: Secondary | ICD-10-CM

## 2022-11-10 DIAGNOSIS — M19049 Primary osteoarthritis, unspecified hand: Secondary | ICD-10-CM | POA: Insufficient documentation

## 2022-11-10 DIAGNOSIS — K219 Gastro-esophageal reflux disease without esophagitis: Secondary | ICD-10-CM | POA: Diagnosis not present

## 2022-11-10 DIAGNOSIS — M19079 Primary osteoarthritis, unspecified ankle and foot: Secondary | ICD-10-CM | POA: Insufficient documentation

## 2022-11-10 DIAGNOSIS — E785 Hyperlipidemia, unspecified: Secondary | ICD-10-CM | POA: Insufficient documentation

## 2022-11-10 DIAGNOSIS — M1990 Unspecified osteoarthritis, unspecified site: Secondary | ICD-10-CM | POA: Insufficient documentation

## 2022-11-10 DIAGNOSIS — Z87891 Personal history of nicotine dependence: Secondary | ICD-10-CM | POA: Diagnosis not present

## 2022-11-10 DIAGNOSIS — E538 Deficiency of other specified B group vitamins: Secondary | ICD-10-CM | POA: Insufficient documentation

## 2022-11-10 DIAGNOSIS — D7589 Other specified diseases of blood and blood-forming organs: Secondary | ICD-10-CM | POA: Diagnosis not present

## 2022-11-10 DIAGNOSIS — R140 Abdominal distension (gaseous): Secondary | ICD-10-CM | POA: Diagnosis not present

## 2022-11-10 LAB — CBC WITH DIFFERENTIAL (CANCER CENTER ONLY)
Abs Immature Granulocytes: 0.01 10*3/uL (ref 0.00–0.07)
Basophils Absolute: 0.1 10*3/uL (ref 0.0–0.1)
Basophils Relative: 2 %
Eosinophils Absolute: 0.3 10*3/uL (ref 0.0–0.5)
Eosinophils Relative: 8 %
HCT: 40.8 % (ref 36.0–46.0)
Hemoglobin: 14.2 g/dL (ref 12.0–15.0)
Immature Granulocytes: 0 %
Lymphocytes Relative: 29 %
Lymphs Abs: 1.2 10*3/uL (ref 0.7–4.0)
MCH: 34.6 pg — ABNORMAL HIGH (ref 26.0–34.0)
MCHC: 34.8 g/dL (ref 30.0–36.0)
MCV: 99.5 fL (ref 80.0–100.0)
Monocytes Absolute: 0.3 10*3/uL (ref 0.1–1.0)
Monocytes Relative: 8 %
Neutro Abs: 2.2 10*3/uL (ref 1.7–7.7)
Neutrophils Relative %: 53 %
Platelet Count: 216 10*3/uL (ref 150–400)
RBC: 4.1 MIL/uL (ref 3.87–5.11)
RDW: 11.8 % (ref 11.5–15.5)
WBC Count: 4.1 10*3/uL (ref 4.0–10.5)
nRBC: 0 % (ref 0.0–0.2)

## 2022-11-10 LAB — FERRITIN: Ferritin: 47 ng/mL (ref 11–307)

## 2022-11-10 LAB — SAVE SMEAR(SSMR), FOR PROVIDER SLIDE REVIEW

## 2022-11-10 NOTE — Progress Notes (Signed)
[name]   Requesting MD: Rodrigo Ran, Md [address] [place],  [place] [address]   [name] 72 y.o.  March 17, 1951    Reason for Snyder: Leukopenia/neutropenia   HPI: Ms. [name] is referred for evaluation of leukopenia.  She saw Dr. Waynard Edwards for routine follow-up on 09/30/2022.  The WBC returned at 2.81, absolute site count 1.0, and ANC 1.2 (1.4-7).  The platelets returned at 189,000, hemoglobin 14.2, MCV 104.3, and MCH 35.3.  She reports a history of vitamin B12 deficiency and iron deficiency.  She is taking vitamin B12 and iron supplements.  She saw Dr. Candise Che in 2020.  And antiparietal cell antibody returned positive on 05/06/2019.  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  He recommended she continue vitamin B12 therapy.  Ms. [name] feels well at present.  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  G1, P1     .  Irritable bowel syndrome   .  History of iron deficiency   .  Glaucoma  Past Surgical History:  Procedure Laterality Date   CARPAL TUNNEL RELEASE  02/03/2012   Procedure: CARPAL TUNNEL RELEASE;  Surgeon: Wyn Forster., MD;  Location: [facility];  Service: Orthopedics;  Laterality: Left;   CARPAL TUNNEL RELEASE  04/20/2012   Procedure: CARPAL TUNNEL RELEASE;  Surgeon: Wyn Forster., MD;  Location: [facility];  Service: Orthopedics;  Laterality: Right;  RIGHT CARPAL TUNNEL RELEASE    COMBINED HYSTEROSCOPY DIAGNOSTIC / D&C  06/29/2001   with resectoscopic polypectomy   I & D EXTREMITY Bilateral 09/23/2016   Procedure: IRRIGATION AND DEBRIDEMENT BILATERAL ARMS;  Surgeon:  Dominica Severin, MD;  Location: [facility];  Service: Orthopedics;  Laterality: Bilateral;   TRIGGER FINGER RELEASE  04/20/2012   Procedure: RELEASE TRIGGER FINGER/A-1 PULLEY;  Surgeon: Wyn Forster., MD;  Location: [facility];  Service: Orthopedics;  Laterality: Right;  RIGHT LONG A-1 RELEASE, RIGHT RING A-1 PULLEY RELEASE     Medications: Reviewed  Allergies:  Allergies  Allergen Reactions   Penicillins Other (See Comments)    SYNCOPE  Childhood Allergy   Alpha-Gal     Caused by tick bite, allergic to red meats, pork and lamb.     Family history: No family history of hematologic condition or malignancy  Social History:   She lives with her husband and daughter in [place].  She has anterior designer.  She quit smoking cigarettes at age 37.  No alcohol use.  No transfusion history.  No risk factor for HIV or hepatitis.  ROS:   Positives include: Abdominal bloating for the past 6-7 months, arthritis of the hands and feet  A complete ROS was otherwise negative.  Physical Exam:  Blood pressure 127/80, pulse 74, temperature 98.1 F (36.7 C), temperature source Oral, resp. rate 18, height 5\' 2"  (1.575 m), weight 117 lb 4.8 oz (53.2 kg), SpO2 100 %.  HEENT: Oropharynx without visible mass, neck without mass Lungs: Clear bilaterally Cardiac: Regular rate and rhythm Abdomen: No hepatosplenomegaly  Vascular: No leg edema Lymph nodes: No cervical, supraclavicular, axillary, or inguinal  nodes Neurologic: Alert and oriented, the motor exam appears intact in the upper and lower extremities bilaterally Skin: Multiple white/tan papules over the feet Musculoskeletal: No spine tenderness   LAB:  CBC  Lab Results  Component Value Date   WBC 4.1 11/10/2022   HGB 14.2 11/10/2022   HCT 40.8 11/10/2022   MCV 99.5 11/10/2022   PLT 216 11/10/2022   NEUTROABS 2.2 11/10/2022    Blood smear: The platelets appear normal in number.  Few large platelets.  The white  cells appear unremarkable.  The majority the white cells are mature neutrophils and lymphocytes.  No blasts or other young forms.  The polychromasia is not increased.  No nucleated red cells.  Rare ovalocytes, teardrop, and target cell.    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 Ewing Schlein Osteoarthritis Hyperlipidemia   Disposition:   Ms. [name] is referred for evaluation of leukopenia.  Review of the medical record reveals chronic mild neutropenia.  The neutrophil count is in the low normal range today.  I suspect the neutropenia is a benign normal variant or related to autoimmune neutropenia.  She does not have a history of recurrent infections.  I recommend she remain up-to-date on influenza and pneumonia vaccines.  She will seek medical attention for symptoms of an infection.  She will continue vitamin B12 replacement.  She reports a history of iron deficiency.  Serum iron studies were normal 09/30/2022.  She requested a ferritin level today.  She will follow-up with Dr. Waynard Edwards and Dr. Ewing Schlein to discuss the indication for continuing iron therapy and any further evaluation that is needed.  She had Red cell macrocytosis in April  2023 in April 2024 from Dr. Laurey Morale lab.  The MCV has been in the upper normal range in our lab.  I suspect this is benign normal variation.  She could have early myelodysplasia or the MCV could be related to unrecognized liver disease.  Ms. [name] plans to continue follow-up with Dr. Waynard Edwards.  I recommend that he check a CBC with her yearly physical.  I will be glad to see her if she develops a progressive hematologic abnormality.  Thornton Papas, MD  11/10/2022, 10:46 AM

## 2023-07-02 ENCOUNTER — Other Ambulatory Visit: Payer: Self-pay | Admitting: Internal Medicine

## 2023-07-02 DIAGNOSIS — Z1231 Encounter for screening mammogram for malignant neoplasm of breast: Secondary | ICD-10-CM

## 2023-08-06 ENCOUNTER — Ambulatory Visit
Admission: RE | Admit: 2023-08-06 | Discharge: 2023-08-06 | Disposition: A | Discharge status: Home / Self Care | Payer: Medicare Other | Source: Ambulatory Visit | Attending: Internal Medicine | Admitting: Internal Medicine

## 2023-08-06 DIAGNOSIS — Z1231 Encounter for screening mammogram for malignant neoplasm of breast: Secondary | ICD-10-CM

## 2024-01-01 LAB — LAB REPORT - SCANNED: EGFR: 61.9

## 2024-06-15 ENCOUNTER — Other Ambulatory Visit: Payer: Self-pay | Admitting: Obstetrics & Gynecology

## 2024-06-15 DIAGNOSIS — Z1231 Encounter for screening mammogram for malignant neoplasm of breast: Secondary | ICD-10-CM

## 2024-08-08 ENCOUNTER — Ambulatory Visit
